# Patient Record
Sex: Male | Born: 1960 | Race: White | Hispanic: No | Marital: Married | Smoking: Never smoker
Health system: Southern US, Community
[De-identification: ages and names within clinical notes are randomized; demographics above are authoritative.]

## PROBLEM LIST (undated history)

## (undated) DIAGNOSIS — I2510 Atherosclerotic heart disease of native coronary artery without angina pectoris: Secondary | ICD-10-CM

## (undated) DIAGNOSIS — M1990 Unspecified osteoarthritis, unspecified site: Secondary | ICD-10-CM

## (undated) DIAGNOSIS — M109 Gout, unspecified: Secondary | ICD-10-CM

## (undated) DIAGNOSIS — C801 Malignant (primary) neoplasm, unspecified: Secondary | ICD-10-CM

## (undated) DIAGNOSIS — N200 Calculus of kidney: Secondary | ICD-10-CM

## (undated) DIAGNOSIS — K219 Gastro-esophageal reflux disease without esophagitis: Secondary | ICD-10-CM

## (undated) DIAGNOSIS — R0602 Shortness of breath: Secondary | ICD-10-CM

## (undated) DIAGNOSIS — I10 Essential (primary) hypertension: Secondary | ICD-10-CM

## (undated) DIAGNOSIS — G4730 Sleep apnea, unspecified: Secondary | ICD-10-CM

## (undated) DIAGNOSIS — D649 Anemia, unspecified: Secondary | ICD-10-CM

## (undated) DIAGNOSIS — E119 Type 2 diabetes mellitus without complications: Secondary | ICD-10-CM

## (undated) DIAGNOSIS — Z8719 Personal history of other diseases of the digestive system: Secondary | ICD-10-CM

## (undated) DIAGNOSIS — Z87442 Personal history of urinary calculi: Secondary | ICD-10-CM

## (undated) DIAGNOSIS — J45909 Unspecified asthma, uncomplicated: Secondary | ICD-10-CM

## (undated) HISTORY — PX: CORONARY STENT INTERVENTION: CATH118234

## (undated) HISTORY — PX: CHOLECYSTECTOMY: SHX55

---

## 1992-06-30 DIAGNOSIS — J45909 Unspecified asthma, uncomplicated: Secondary | ICD-10-CM

## 1992-06-30 HISTORY — DX: Unspecified asthma, uncomplicated: J45.909

## 1999-07-01 HISTORY — PX: LITHOTRIPSY: SUR834

## 2002-06-30 HISTORY — PX: ARTHROSCOPY KNEE W/ DRILLING: SUR92

## 2003-07-01 HISTORY — PX: NASAL SEPTUM SURGERY: SHX37

## 2012-10-28 ENCOUNTER — Other Ambulatory Visit: Payer: Self-pay | Admitting: Urology

## 2012-11-04 ENCOUNTER — Encounter (HOSPITAL_COMMUNITY): Payer: Self-pay | Admitting: Pharmacy Technician

## 2012-11-16 ENCOUNTER — Encounter (HOSPITAL_COMMUNITY): Payer: Self-pay | Admitting: *Deleted

## 2012-11-25 ENCOUNTER — Ambulatory Visit (HOSPITAL_COMMUNITY)
Admission: RE | Admit: 2012-11-25 | Discharge: 2012-11-25 | Disposition: A | Discharge status: Home / Self Care | Payer: BC Managed Care – PPO | Source: Ambulatory Visit | Attending: Urology | Admitting: Urology

## 2012-11-25 ENCOUNTER — Encounter (HOSPITAL_COMMUNITY): Payer: Self-pay

## 2012-11-25 ENCOUNTER — Encounter (HOSPITAL_COMMUNITY)
Admission: RE | Disposition: A | Discharge status: Home / Self Care | Payer: Self-pay | Source: Ambulatory Visit | Attending: Urology

## 2012-11-25 ENCOUNTER — Ambulatory Visit (HOSPITAL_COMMUNITY): Payer: BC Managed Care – PPO

## 2012-11-25 DIAGNOSIS — N200 Calculus of kidney: Secondary | ICD-10-CM | POA: Insufficient documentation

## 2012-11-25 DIAGNOSIS — Z888 Allergy status to other drugs, medicaments and biological substances status: Secondary | ICD-10-CM | POA: Insufficient documentation

## 2012-11-25 DIAGNOSIS — K589 Irritable bowel syndrome without diarrhea: Secondary | ICD-10-CM | POA: Insufficient documentation

## 2012-11-25 DIAGNOSIS — R339 Retention of urine, unspecified: Secondary | ICD-10-CM | POA: Insufficient documentation

## 2012-11-25 DIAGNOSIS — K219 Gastro-esophageal reflux disease without esophagitis: Secondary | ICD-10-CM | POA: Insufficient documentation

## 2012-11-25 DIAGNOSIS — K5730 Diverticulosis of large intestine without perforation or abscess without bleeding: Secondary | ICD-10-CM | POA: Insufficient documentation

## 2012-11-25 DIAGNOSIS — Z79899 Other long term (current) drug therapy: Secondary | ICD-10-CM | POA: Insufficient documentation

## 2012-11-25 DIAGNOSIS — IMO0002 Reserved for concepts with insufficient information to code with codable children: Secondary | ICD-10-CM | POA: Insufficient documentation

## 2012-11-25 HISTORY — DX: Sleep apnea, unspecified: G47.30

## 2012-11-25 HISTORY — DX: Unspecified asthma, uncomplicated: J45.909

## 2012-11-25 HISTORY — DX: Calculus of kidney: N20.0

## 2012-11-25 HISTORY — DX: Gastro-esophageal reflux disease without esophagitis: K21.9

## 2012-11-25 HISTORY — DX: Essential (primary) hypertension: I10

## 2012-11-25 HISTORY — DX: Shortness of breath: R06.02

## 2012-11-25 SURGERY — LITHOTRIPSY, ESWL
Anesthesia: LOCAL | Laterality: Right

## 2012-11-25 MED ORDER — DEXTROSE-NACL 5-0.45 % IV SOLN
INTRAVENOUS | Status: DC
  Administered 2012-11-25: 08:00:00 via INTRAVENOUS

## 2012-11-25 MED ORDER — DIPHENHYDRAMINE HCL 25 MG PO CAPS
25.0000 mg | ORAL_CAPSULE | ORAL | Status: AC
  Administered 2012-11-25: 25 mg via ORAL
  Filled 2012-11-25: qty 1

## 2012-11-25 MED ORDER — CIPROFLOXACIN HCL 500 MG PO TABS
500.0000 mg | ORAL_TABLET | ORAL | Status: AC
  Administered 2012-11-25: 500 mg via ORAL
  Filled 2012-11-25: qty 1

## 2012-11-25 MED ORDER — DIAZEPAM 5 MG PO TABS
10.0000 mg | ORAL_TABLET | ORAL | Status: AC
  Administered 2012-11-25: 10 mg via ORAL
  Filled 2012-11-25: qty 2

## 2012-11-25 NOTE — Interval H&P Note (Signed)
History and Physical Interval Note:  11/25/2012 8:56 AM  Hunter Krause  has presented today for surgery, with the diagnosis of RIGHT RENAL STONE  The various methods of treatment have been discussed with the patient and family. After consideration of risks, benefits and other options for treatment, the patient has consented to  Procedure(s): RIGHT EXTRACORPOREAL SHOCK WAVE LITHOTRIPSY (ESWL) (Right) as a surgical intervention .  The patient's history has been reviewed, patient examined, no change in status, stable for surgery.  I have reviewed the patient's chart and labs.  Questions were answered to the patient's satisfaction.     [name] I

## 2012-11-25 NOTE — H&P (Signed)
ason For Visit  1 mo f/u & to review CT/24hr urine results   Active Problems Problems  1. Abdominal Pain In The Left Lower Belly (LLQ) 789.04 2. Flank Pain Left 3. Incomplete Emptying Of Bladder 788.21 4. Nephrolithiasis 592.0  History of Present Illness          52 YO male returns today for a 1 mo f/u to review CT & 24hr urine results.  He continues to have some Lt flank discomfort.  He was last seen by Jetta Lout, NP on 09/23/12 as a work-in with complaint of LLQ and left pain.   Last seen 09/15/12 for a 6 mo f/u & KUB was having LLQ pain at that time and since 09/11/12. He has spontanously passed 2 stones since 12/13.  Drinks 2-3 cans of Dt. Coke/day.   Hx of calicum oxlate stones and diverticulosis. Takes HCTZ 12.5 mg 1 po daily, Urocit-K 10 MEQ 2 po BID, Beelith 1 po BID, Allopurinol 300 mg 1 po daily, and Tamsulosin when he feels like he has a kidney stone moving. Takes Hyoscyamaine for IBS. Had abdominal ultrasound 2009 and it showed some small stones in each kidney- No hydronephrosis, masses, or ureteral obstruction- rest of organs viewed were normal. Hx of diverticulosis seen on CT 2009.   Had recent CT abd/pelvis 11/13 which showed bilateral nonobstructing renal stones,. KUB: 09/15/12: bilateral nonobstructing renal calculi. No bilateral ureteral calculus noted.  07/30/10  PSA - 0.78   Past Medical History Problems  1. History of  Diverticulosis 562.10 2. History of  Esophageal Reflux 530.81 3. History of  Irritable Bowel Syndrome 564.1 4. History of  Nephrolithiasis V13.01  Surgical History Problems  1. History of  Knee Surgery  Current Meds 1. Advair Diskus 500-50 MCG/DOSE Inhalation Aerosol Powder Breath Activated; Therapy:  26Feb2013 to 2. Albuterol Sulfate (2.5 MG/3ML) 0.083% Inhalation Nebulization Solution; Therapy: 07Feb2013 to 3. Aleve TABS; Therapy: (Recorded:10Jan2012) to 4. Allopurinol 300 MG Oral Tablet; TAKE 1 TABLET (300 MG TOTAL) BY MOUTH DAILY;  Therapy:  16Sep2013 to (Evaluate:18Dec2014)  Requested for: 31Dec2013; Last Rx:23Dec2013 5. Azelastine HCl 137 MCG/SPRAY Nasal Solution; Therapy: 11Oct2013 to 6. Beelith 362-20 MG Oral Tablet; TAKE 1 TABLET Twice daily; Therapy: 22Feb2013 to  (Evaluate:17Feb2014)  Requested for: 22Feb2013; Last Rx:22Feb2013 7. Clarinex TABS; Therapy: (Recorded:17Sep2013) to 8. Combivent Respimat 20-100 MCG/ACT Inhalation Aerosol Solution; Therapy: 21Aug2013 to 9. Hydrochlorothiazide 25 MG Oral Tablet; TAKE 1 TABLET DAILY; Therapy: 22Feb2013 to  (Evaluate:06Nov2014)  Requested for: 11Mar2014; Last Rx:11Mar2014 10. Melatonin TABS; Therapy: (Recorded:17Sep2013) to 11. Montelukast Sodium 10 MG Oral Tablet; Therapy: 20Sep2013 to 12. Nystatin 100000 UNIT/ML Mouth/Throat Suspension; Therapy: 08Nov2013 to 13. Prevacid 30 MG Oral Capsule Delayed Release; Therapy: (Recorded:27May2009) to 14. Tamsulosin HCl 0.4 MG Oral Capsule; TAKE 1 CAPSULE BY MOUTH BEDTIME; Therapy:   22Aug2012 to (Evaluate:17Oct2014)  Requested for: 22Oct2013; Last Rx:22Oct2013 15. Tylenol PM Extra Strength TABS; Therapy: (Recorded:17Sep2013) to 16. Urocit-K 15 15 MEQ (1620 MG) Oral Tablet Extended Release; take 2 tablets by mouth twice a   day; Therapy: 17Sep2013 to (Evaluate:14Mar2014)  Requested for: 20Sep2013; Last   Rx:20Sep2013 17. Zyflo 600 MG Oral Tablet; Therapy: 11Oct2013 to  Allergies Medication  1. Spiriva HandiHaler CAPS  Family History Problems  1. Family history of  Death In The Family Father deceased age 38--heart 2. Family history of  Death In The Family Mother deceased age 97--stroke 3. Maternal history of  Diabetes Mellitus V18.0 4. Maternal history of  Family Health Status Number Of Children 1 son 1  daughter 5. Family history of  Heart Disease V17.49 6. Paternal history of  Heart Disease V17.49 7. Maternal history of  Ischemic Stroke V17.1 8. Family history of  Nephrolithiasis 9. Paternal history of   Nephrolithiasis 10. Family history of  Prostate Cancer V16.42 11. Maternal history of  Stroke Syndrome V17.1  Social History Problems  1. Alcohol Use 2. Caffeine Use 3. Marital History - Currently Married 4. Never A Smoker Denied  5. Tobacco Use  Review of Systems Genitourinary, constitutional, skin, eye, otolaryngeal, hematologic/lymphatic, cardiovascular, pulmonary, endocrine, musculoskeletal, gastrointestinal, neurological and psychiatric system(s) were reviewed and pertinent findings if present are noted.  Genitourinary: hematuria.  Gastrointestinal: flank pain.    Vitals Vital Signs [Data Includes: Last 1 Day]  30Apr2014 11:17AM  Blood Pressure: 136 / 88 Temperature: 98.3 F Heart Rate: 87  Physical Exam Constitutional: Well nourished and well developed . No acute distress.  ENT:. The ears and nose are normal in appearance.  Neck: The appearance of the neck is normal and no neck mass is present.  Pulmonary: No respiratory distress.  Cardiovascular:. The arterial pulses are normal. No peripheral edema.  Abdomen: The abdomen is soft and nontender. No masses are palpated. No CVA tenderness. No hernias are palpable. No hepatosplenomegaly noted.  Genitourinary: Examination of the penis demonstrates no discharge, no masses, no lesions and a normal meatus. The scrotum is without lesions. The right epididymis is palpably normal and non-tender. The left epididymis is palpably normal and non-tender. The right testis is non-tender and without masses. The left testis is non-tender and without masses.  Lymphatics: The femoral and inguinal nodes are not enlarged or tender.  Skin: Normal skin turgor, no visible rash and no visible skin lesions.  Neuro/Psych:. Mood and affect are appropriate.    Results/Data Urine [Data Includes: Last 1 Day]   30Apr2014  COLOR YELLOW   APPEARANCE CLEAR   SPECIFIC GRAVITY 1.020   pH 6.0   GLUCOSE NEG mg/dL  BILIRUBIN NEG   KETONE NEG mg/dL  BLOOD  TRACE   PROTEIN NEG mg/dL  UROBILINOGEN 0.2 mg/dL  NITRITE NEG   LEUKOCYTE ESTERASE NEG   SQUAMOUS EPITHELIAL/HPF NONE SEEN   WBC NONE SEEN WBC/hpf  RBC 0-2 RBC/hpf  BACTERIA NONE SEEN   CRYSTALS NONE SEEN   CASTS NONE SEEN   Selected Results  CT-ABD/PELVIS W/W/O CONTRAST 02Apr2014 12:00AM Hunter Krause   Test Name Result Flag Reference  ** RADIOLOGY REPORT BY [facility] **   *RADIOLOGY REPORT*  Clinical Data: Left lower quadrant pain for the past few months. History of irritable bowel syndrome. History renal stones. Prior lithotripsy.  CT ABDOMEN AND PELVIS WITHOUT AND WITH CONTRAST  Technique: Multidetector CT imaging of the abdomen and pelvis was performed without contrast material in one or both body regions, followed by contrast material(s) and further sections in one or both body regions.  Contrast: 125  Comparison: Plain films of 09/23/2012. CT stone study 05/20/2012.  Findings: Lung bases: Normal  Kidneys/Ureters/Bladder: Bilateral renal collecting system calculi, with the largest stone in the upper pole right kidney at 9 mm. No hydroureter or ureteric calculi.  No bladder calculi. No suspicious renal lesion on postcontrast images. Normal appearance of the renal collecting systems and ureters on delayed images. Normal bladder, incompletely filled on delayed images.  Other: Moderate hepatic steatosis, without focal liver lesion. Small splenule. Normal stomach, pancreas, gallbladder, biliary tract, adrenal glands  Multiple renal arteries bilaterally. Aortic atherosclerosis. No retroperitoneal or retrocrural adenopathy.  Scattered colonic diverticula.  Normal terminal ileum and appendix. Normal small bowel without abdominal ascites.   No pelvic adenopathy.  Normal prostate, without significant free pelvic fluid.  Bones/Musculoskeletal: No acute osseous abnormality.  IMPRESSION:  1. Bilateral nonobstructive renal calculi. 2. No other  explanation for pain. 3. Moderate hepatic steatosis.   Original Report Authenticated By: Jeronimo Greaves, M.D.   Assessment Assessed  1. Nephrolithiasis 592.0      52 yo male with recurrent stones. He has had 1st generation lithotripsy at [facility], and again in [place] in the distant past. He has passed 2 stones recently, which will be sent for analysis.    CT review with the patient shows bilateral stones: R side 9mm RUP stone and larger RLP stone ( possible coalesced small stones). On the L side, he has Upper, mid, and lower pole stones, 2-74mm. He has passed multiple stones in 2012/07/20, 5mm, and these will be sent for analysis. He had to stop Urocit K because of nausea. He sill try again with food.  Fast foods: 2x/week: Mindi Slicker: Whopper/ Fries/ Dt. Coke.   Plan Abdominal Pain In The Left Lower Belly (LLQ) (789.04)  1. TraMADol HCl 50 MG Oral Tablet; TAKE 1 TABLET Every 6 hours PRN; Therapy: 27Mar2014 to  30Apr2014; Last Rx:27Mar2014; Status: DISCONTINUED Health Maintenance (V70.0)  2. UA With REFLEX  Done: 30Apr2014 10:47AM Nephrolithiasis (592.0)  3. Beelith 362-20 MG Oral Tablet; Take 1 tablet twice daily; Therapy: 22Feb2013 to  (Evaluate:25Apr2015)  Requested for: 30Apr2014; Last Rx:30Apr2014 4. Sprix 15.75 MG/SPRAY Nasal Solution; INSTILL 1 SQUIRT Every 8 hours; Therapy: 19Mar2014 to  30Apr2014; Last Rx:19Mar2014; Status: DISCONTINUED 5. Tamsulosin HCl 0.4 MG Oral Capsule; TAKE 1 CAPSULE BY MOUTH BEDTIME; Therapy:  22Aug2012 to (Evaluate:25Apr2015)  Requested for: 30Apr2014; Last Rx:30Apr2014   1. send stones for analysis.  2. Continue tamsulosin, Beelith, HCTZ, allopurinol.  3. lithotripsy of R kidney stones.  4. Begin substitution: Lemonade instead of Dt. Coke ( 16-24 oz). at [facility].   Signatures Electronically signed by : Hunter Krause, M.D.; Oct 27 2012 11:51AM

## 2013-09-27 ENCOUNTER — Other Ambulatory Visit: Payer: Self-pay | Admitting: Urology

## 2013-09-28 ENCOUNTER — Encounter (HOSPITAL_COMMUNITY): Payer: Self-pay | Admitting: Pharmacy Technician

## 2013-10-13 ENCOUNTER — Encounter (HOSPITAL_COMMUNITY): Payer: Self-pay | Admitting: *Deleted

## 2013-10-20 ENCOUNTER — Ambulatory Visit (HOSPITAL_COMMUNITY)
Admission: RE | Admit: 2013-10-20 | Discharge: 2013-10-20 | Disposition: A | Discharge status: Home / Self Care | Payer: BC Managed Care – PPO | Source: Ambulatory Visit | Attending: Urology | Admitting: Urology

## 2013-10-20 ENCOUNTER — Ambulatory Visit (HOSPITAL_COMMUNITY): Payer: BC Managed Care – PPO

## 2013-10-20 ENCOUNTER — Encounter (HOSPITAL_COMMUNITY)
Admission: RE | Disposition: A | Discharge status: Home / Self Care | Payer: Self-pay | Source: Ambulatory Visit | Attending: Urology

## 2013-10-20 ENCOUNTER — Encounter (HOSPITAL_COMMUNITY): Payer: Self-pay | Admitting: *Deleted

## 2013-10-20 DIAGNOSIS — R310 Gross hematuria: Secondary | ICD-10-CM | POA: Insufficient documentation

## 2013-10-20 DIAGNOSIS — K449 Diaphragmatic hernia without obstruction or gangrene: Secondary | ICD-10-CM | POA: Insufficient documentation

## 2013-10-20 DIAGNOSIS — J45909 Unspecified asthma, uncomplicated: Secondary | ICD-10-CM | POA: Insufficient documentation

## 2013-10-20 DIAGNOSIS — I10 Essential (primary) hypertension: Secondary | ICD-10-CM | POA: Insufficient documentation

## 2013-10-20 DIAGNOSIS — N200 Calculus of kidney: Secondary | ICD-10-CM

## 2013-10-20 DIAGNOSIS — E669 Obesity, unspecified: Secondary | ICD-10-CM | POA: Insufficient documentation

## 2013-10-20 DIAGNOSIS — R109 Unspecified abdominal pain: Secondary | ICD-10-CM | POA: Insufficient documentation

## 2013-10-20 DIAGNOSIS — Z79899 Other long term (current) drug therapy: Secondary | ICD-10-CM | POA: Insufficient documentation

## 2013-10-20 DIAGNOSIS — K219 Gastro-esophageal reflux disease without esophagitis: Secondary | ICD-10-CM | POA: Insufficient documentation

## 2013-10-20 SURGERY — LITHOTRIPSY, ESWL
Anesthesia: LOCAL | Laterality: Right

## 2013-10-20 MED ORDER — CIPROFLOXACIN HCL 500 MG PO TABS
500.0000 mg | ORAL_TABLET | ORAL | Status: AC
  Administered 2013-10-20: 500 mg via ORAL
  Filled 2013-10-20: qty 1

## 2013-10-20 MED ORDER — DIAZEPAM 5 MG PO TABS
10.0000 mg | ORAL_TABLET | ORAL | Status: AC
  Administered 2013-10-20: 10 mg via ORAL
  Filled 2013-10-20: qty 2

## 2013-10-20 MED ORDER — DIPHENHYDRAMINE HCL 25 MG PO CAPS
25.0000 mg | ORAL_CAPSULE | ORAL | Status: AC
  Administered 2013-10-20: 25 mg via ORAL
  Filled 2013-10-20: qty 1

## 2013-10-20 MED ORDER — DEXTROSE-NACL 5-0.45 % IV SOLN
INTRAVENOUS | Status: DC
  Administered 2013-10-20: 08:00:00 via INTRAVENOUS

## 2013-10-20 NOTE — H&P (Signed)
Reason For Visit 6 mo f/u   Active Problems Problems  1. Gross hematuria (599.71)   Assessed By: [name] (Urology); Last Assessed: 26 Sep 2013 2. Incomplete bladder emptying (788.21) 3. Lower abdominal pain (789.09) 4. Lower back pain (724.2) 5. Nephrolithiasis (592.0)   Assessed By: [name] (Urology); Last Assessed: 26 Sep 2013 6. Right flank pain (789.09)   Assessed By: [name] (Urology); Last Assessed: 26 Sep 2013  History of Present Illness     53 yo male returns today for a 6 mo f/u with a hx of kidney stones. He states that he has been having intermittent Rt flank pain since last week & feels like he may be trying to pass a kidney stone. He hasn't been taking the Effer-K as prescribed because of stomach pain.     He was last seen by Denna Haggard, NP on 08/03/13 for right flank pain. This has been present intermittently he reports since probably around November. It occurs about 3 times per week and is brief in duration. When it occurs nothing makes it better or worse. He denies associated n/v or f/c. He reports he probably passes about 1 small stone a week. He has had his gallbladder removed since we last saw him.      He has a known large stone burden bilaterally. He takes tamsulosin, Beelith, HCTZ, and allopurinol daily.     He is status post ESWL on 11/25/12 for a right renal stone. After this he passed many large fragments. He had left flank pain post op. KUB and renal u/s showed no obvious obstructing stones that would be causing pain. CT 12/28/12 for ongoing flank pain showed bilateral non obstructing stones.   Past Medical History Problems  1. History of Diverticulosis (562.10) 2. History of esophageal reflux (V12.79) 3. History of kidney stones (V13.01) 4. History of Irritable bowel syndrome with diarrhea (564.1)  Surgical History Problems  1. History of Knee Surgery 2. History of Lithotripsy  Current Meds 1. Advair HFA  115-21 MCG/ACT Inhalation Aerosol;  Therapy: 11Apr2014 to Recorded 2. Albuterol Sulfate (2.5 MG/3ML) 0.083% Inhalation Nebulization Solution;  Therapy: 07Feb2013 to Recorded 3. Aleve TABS;  Therapy: (Recorded:10Jan2012) to Recorded 4. Allopurinol 300 MG Oral Tablet; TAKE 1 TABLET (300 MG TOTAL) BY MOUTH DAILY;  Therapy: 16Sep2013 to (Evaluate:20Sep2015)  Requested for: 24Mar2015; Last  Rx:24Mar2015 Ordered 5. Aspirin 81 MG Oral Tablet Delayed Release;  Therapy: 06Dec2014 to Recorded 6. Atorvastatin Calcium 10 MG Oral Tablet;  Therapy: 06Dec2014 to Recorded 7. Azelastine HCl - 137 MCG/SPRAY Nasal Solution;  Therapy: 11Oct2013 to Recorded 8. Clarinex TABS;  Therapy: (Recorded:17Sep2013) to Recorded 9. Effer-K 25 MEQ Oral Tablet Effervescent; TAKE AS DIRECTED;  Therapy: 29Sep2014 to (Evaluate:28Mar2015); Last Rx:29Sep2014 Ordered 10. Fluticasone Propionate 50 MCG/ACT Nasal Suspension;   Therapy: 20Dec2013 to Recorded 11. Hydrochlorothiazide 25 MG Oral Tablet; TAKE 1 TABLET DAILY;   Therapy: 22Feb2013 to (Evaluate:06Nov2014)  Requested for: 11Mar2014; Last   Rx:11Mar2014 Ordered 12. Ketorolac Tromethamine 10 MG Oral Tablet; Take 1 tablet every 6 hours;   Therapy: 20Nov2011 to (Last Rx:24Feb2015)  Requested for: 24Feb2015 Ordered 13. Melatonin TABS;   Therapy: (Recorded:17Sep2013) to Recorded 14. Prevacid 30 MG Oral Capsule Delayed Release;   Therapy: (Recorded:27May2009) to Recorded 15. ProAir HFA 108 (90 Base) MCG/ACT Inhalation Aerosol Solution;   Therapy: 26Mar2015 to Recorded 16. Promethazine HCl - 25 MG Oral Tablet; TAKE 1 TABLET EVERY 6 HOURS AS NEEDED   FOR NAUSEA;   Therapy: 12Jun2014 to (Evaluate:17Jun2014)  Requested for:  12Jun2014; Last   Rx:12Jun2014 Ordered 17. Qvar 80 MCG/ACT Inhalation Aerosol Solution;   Therapy: 19Mar2015 to Recorded 18. Tamsulosin HCl - 0.4 MG Oral Capsule; TAKE 1 CAPSULE BY MOUTH BEDTIME;   Therapy: 22Aug2012 to (Evaluate:25Apr2015)  Requested  for: 30Apr2014; Last   Rx:30Apr2014 Ordered 19. Tylenol PM Extra Strength TABS;   Therapy: (Recorded:17Sep2013) to Recorded 20. Zyflo 600 MG Oral Tablet;   Therapy: 11Oct2013 to Recorded  Allergies Medication  1. Spiriva HandiHaler CAPS  Family History Problems  1. Family history of Death In The Family Father   deceased age 71--heart 2. Family history of Death In The Family Mother   deceased age 71--stroke 3. Family history of Diabetes Mellitus (V18.0) : Mother 4. Family history of Family Health Status Number Of Children : Mother   1 son 1 daughter 5. Family history of Heart Disease (V17.49) 6. Family history of Heart Disease (V17.49) : Father 7. Family history of Ischemic Stroke (V17.1) : Mother 8. Family history of Nephrolithiasis 9. Family history of Nephrolithiasis : Father 78. Family history of Prostate Cancer (V16.42) 11. Family history of Stroke Syndrome (V17.1) : Mother  Social History Problems  1. Alcohol Use 2. Caffeine Use 3. Marital History - Currently Married 4. Never A Smoker 5. Denied: Tobacco Use  Review of Systems Genitourinary, constitutional, skin, eye, otolaryngeal, hematologic/lymphatic, cardiovascular, pulmonary, endocrine, musculoskeletal, gastrointestinal, neurological and psychiatric system(s) were reviewed and pertinent findings if present are noted.  Genitourinary: hematuria.  Gastrointestinal: flank pain.    Vitals Vital Signs [Data Includes: Last 1 Day]  Recorded: 30Mar2015 02:42PM  Height: 5 ft 9 in Weight: 263 lb  BMI Calculated: 38.84 BSA Calculated: 2.32 Blood Pressure: 136 / 85 Temperature: 98.1 F Heart Rate: 100  Physical Exam Constitutional: Well nourished and well developed . No acute distress.  ENT:. The ears and nose are normal in appearance.  Pulmonary: No respiratory distress and normal respiratory rhythm and effort.  Cardiovascular: Heart rate and rhythm are normal . No peripheral edema.  Abdomen: The abdomen is  obese. Mild tenderness in the RUQ is present. moderate right CVA tenderness.  Genitourinary: Examination of the penis demonstrates no discharge, no masses, no lesions and a normal meatus. The scrotum is without lesions. The right epididymis is palpably normal and non-tender. The left epididymis is palpably normal and non-tender. The right testis is non-tender and without masses. The left testis is non-tender and without masses.  Lymphatics: The femoral and inguinal nodes are not enlarged or tender.  Skin: Normal skin turgor, no visible rash and no visible skin lesions.  Neuro/Psych:. Mood and affect are appropriate.    Results/Data Urine [Data Includes: Last 1 Day]   30Mar2015  COLOR YELLOW   APPEARANCE CLEAR   SPECIFIC GRAVITY 1.010   pH 6.5   GLUCOSE NEG mg/dL  BILIRUBIN NEG   KETONE NEG mg/dL  BLOOD MOD   PROTEIN NEG mg/dL  UROBILINOGEN 0.2 mg/dL  NITRITE NEG   LEUKOCYTE ESTERASE NEG   SQUAMOUS EPITHELIAL/HPF RARE   WBC NONE SEEN WBC/hpf  RBC 0-2 RBC/hpf  BACTERIA NONE SEEN   CRYSTALS NONE SEEN   CASTS NONE SEEN   Selected Results  AU CT-STONE PROTOCOL 04Feb2015 12:00AM Denna Haggard   Test Name Result Flag Reference  CT-STONE PROTOCOL (Report)    ** RADIOLOGY REPORT BY [facility] **   CLINICAL DATA: Right-sided back pain. Hematuria. Urolithiasis.  EXAM: CT ABDOMEN AND PELVIS WITHOUT CONTRAST (URINARY CALCULUS PROTOCOL)  TECHNIQUE: Multidetector CT imaging was performed through the  abdomen and pelvis without intravenous contrast to include the urinary tract.  COMPARISON: 12/28/2012  FINDINGS: Multiple small nonobstructive calculi are again seen in both kidneys largest measuring 9 mm in the mid pole of the right kidney. No evidence of hydronephrosis. No evidence of ureteral calculi or dilatation. No bladder calculi identified.  Diffuse hepatic steatosis again noted. Surgical clips seen from prior cholecystectomy. No evidence of biliary ductal  dilatation. Noncontrast images of the pancreas, spleen, and adrenal glands are normal in appearance.  No soft tissue masses or lymphadenopathy identified in this noncontrast exam. No evidence of inflammatory process or abnormal fluid collections. No evidence of dilated bowel loops. Normal appendix visualized. No suspicious bone lesion seen.  IMPRESSION: Multiple bilateral nonobstructive renal calculi. No evidence of ureteral calculi, hydronephrosis, or other acute findings.  Stable hepatic steatosis.   Electronically Signed  By: Myles RosenthalJohn Stahl M.D.  On: 08/03/2013 15:31   Assessment Assessed  1. Nephrolithiasis (592.0) 2. Right flank pain (789.09) 3. Gross hematuria (599.71)  Gross hematuria, R flank pain , in pt with hx of nephrolithiasis. CT shows bilateral nephrolithiasis, 2 in the R and 2 in the L. largest is 9mm R lower pole stone and 6mm RUP stone. 2 separate 6mm upper and lower pole stones. No hydro.   Plan Gross hematuria, Nephrolithiasis, Right flank pain  1. Administered: Ketorolac Tromethamine 60 MG/2ML Injection Solution 2. AU CT-STONE PROTOCOL; Status:In Progress - Specimen/Data Collected;   Done:  30Mar2015 12:00AM Health Maintenance  3. UA With REFLEX; [Do Not Release]; Status:Resulted - Requires Verification;   Done:  30Mar2015 02:07PM Nephrolithiasis, Right flank pain  4. Start: Oxycodone-Acetaminophen 5-325 MG Oral Tablet; TAKE 1 TABLET Every  4 hours  PRN pain 5. Start: Tamsulosin HCl - 0.4 MG Oral Capsule; TAKE 1 CAPSULE Bedtime Right flank pain  6. Administer: Ketorolac Tromethamine 60 MG/2ML Injection Solution; INJECT 60  MG  Intramuscular  Toradol 60mg  now ans RTC for CT in AM if CT scan is repaired.  Lithotripsy of the R lower pole stone. first. Begin Flomax.   RTC for Sprix script.   Discussion/Summary [facility], [address]     Signatures Electronically signed by : Jethro BolusSigmund Makhari Dovidio, M.D.; Sep 26 2013  4:50PM EST

## 2013-10-20 NOTE — Interval H&P Note (Signed)
History and Physical Interval Note:  10/20/2013 8:44 AM  Hunter Krause  has presented today for surgery, with the diagnosis of Right Renal Stone  The various methods of treatment have been discussed with the patient and family. After consideration of risks, benefits and other options for treatment, the patient has consented to  Procedure(s): RIGHT EXTRACORPOREAL SHOCK WAVE LITHOTRIPSY (ESWL) (Right) as a surgical intervention .  The patient's history has been reviewed, patient examined, no change in status, stable for surgery.  I have reviewed the patient's chart and labs.  Questions were answered to the patient's satisfaction.     Kathi Ludwig

## 2013-10-20 NOTE — Discharge Instructions (Addendum)
Lithotripsy, Care After °Refer to this sheet in the next few weeks. These instructions provide you with information on caring for yourself after your procedure. Your health care provider may also give you more specific instructions. Your treatment has been planned according to current medical practices, but problems sometimes occur. Call your health care provider if you have any problems or questions after your procedure. °WHAT TO EXPECT AFTER THE PROCEDURE  °· Your urine may have a red tinge for a few days after treatment. Blood loss is usually minimal. °· You may have soreness in the back or flank area. This usually goes away after a few days. The procedure can cause blotches or bruises on the back where the pressure wave enters the skin. These marks usually cause only minimal discomfort and should disappear in a short time. °· Stone fragments should begin to pass within 24 hours of treatment. However, a delayed passage is not unusual. °· You may have pain, discomfort, and feel sick to your stomach (nauseated) when the crushed fragments of stone are passed down the tube from the kidney to the bladder. Stone fragments can pass soon after the procedure and may last for up to 4 8 weeks. °· A small number of patients may have severe pain when stone fragments are not able to pass, which leads to an obstruction. °· If your stone is greater than 1 inch (2.5 cm) in diameter or if you have multiple stones that have a combined diameter greater than 1 inch (2.5 cm), you may require more than one treatment. °· If you had a stent placed prior to your procedure, you may experience some discomfort, especially during urination. You may experience the pain or discomfort in your flank or back, or you may experience a sharp pain or discomfort at the base of your penis or in your lower abdomen. The discomfort usually lasts only a few minutes after urinating. °HOME CARE INSTRUCTIONS  °· Rest at home until you feel your energy  improving. °· Only take over-the-counter or prescription medicines for pain, discomfort, or fever as directed by your health care provider. Depending on the type of lithotripsy, you may need to take antibiotics and anti-inflammatory medicines for a few days. °· Drink enough water and fluids to keep your urine clear or pale yellow. This helps "flush" your kidneys. It helps pass any remaining pieces of stone and prevents stones from coming back. °· Most people can resume daily activities within 1 2 days after standard lithotripsy. It can take longer to recover from laser and percutaneous lithotripsy. °· If the stones are in your urinary system, you may be asked to strain your urine at home to look for stones. Any stones that are found can be sent to a medical lab for examination. °· Visit your health care provider for a follow-up appointment in a few weeks. Your doctor may remove your stent if you have one. Your health care provider will also check to see whether stone particles still remain. °SEEK MEDICAL CARE IF:  °· Your pain is not relieved by medicine. °· You have a lasting nauseous feeling. °· You feel there is too much blood in the urine. °· You develop persistent problems with frequent or painful urination that does not at least partially improve after 2 days following the procedure. °· You have a congested cough. °· You feel lightheaded. °· You develop a rash or any other signs that might suggest an allergic problem. °· You develop any reaction or side   effects to your medicine(s). SEEK IMMEDIATE MEDICAL CARE IF:   You experience severe back or flank pain or both.  You see nothing but blood when you urinate.  You cannot pass any urine at all.  You have a fever or shaking chills.  You develop shortness of breath, difficulty breathing, or chest pain.  You develop vomiting that will not stop after 6 8 hours.  You have a fainting episode. Document Released: 07/06/2007 Document Revised: 04/06/2013  Document Reviewed: 12/30/2012 [facility] Patient Information 2014 [place], [place].  Pt needs appointment with his PCP re: p[oorly controlled, labile hypertention.  Avoid: 1. Colas             2. Fast foods. High salt, High animal fat.              3. High doses of Vit c Begin: 3/4 gallon urine/day by drinking water, lemonade

## 2015-06-07 IMAGING — CR DG ABDOMEN 1V
2 series · 2 of 2 positions shown · non-contrast
Comparison: 02/08/2013; correlation CT abdomen and pelvis
09/26/2013

CLINICAL DATA: Nephrolithiasis, preoperative evaluation for right
side lithotripsy

EXAM:
ABDOMEN - 1 VIEW

[t abdomen supine (1 of 2)]
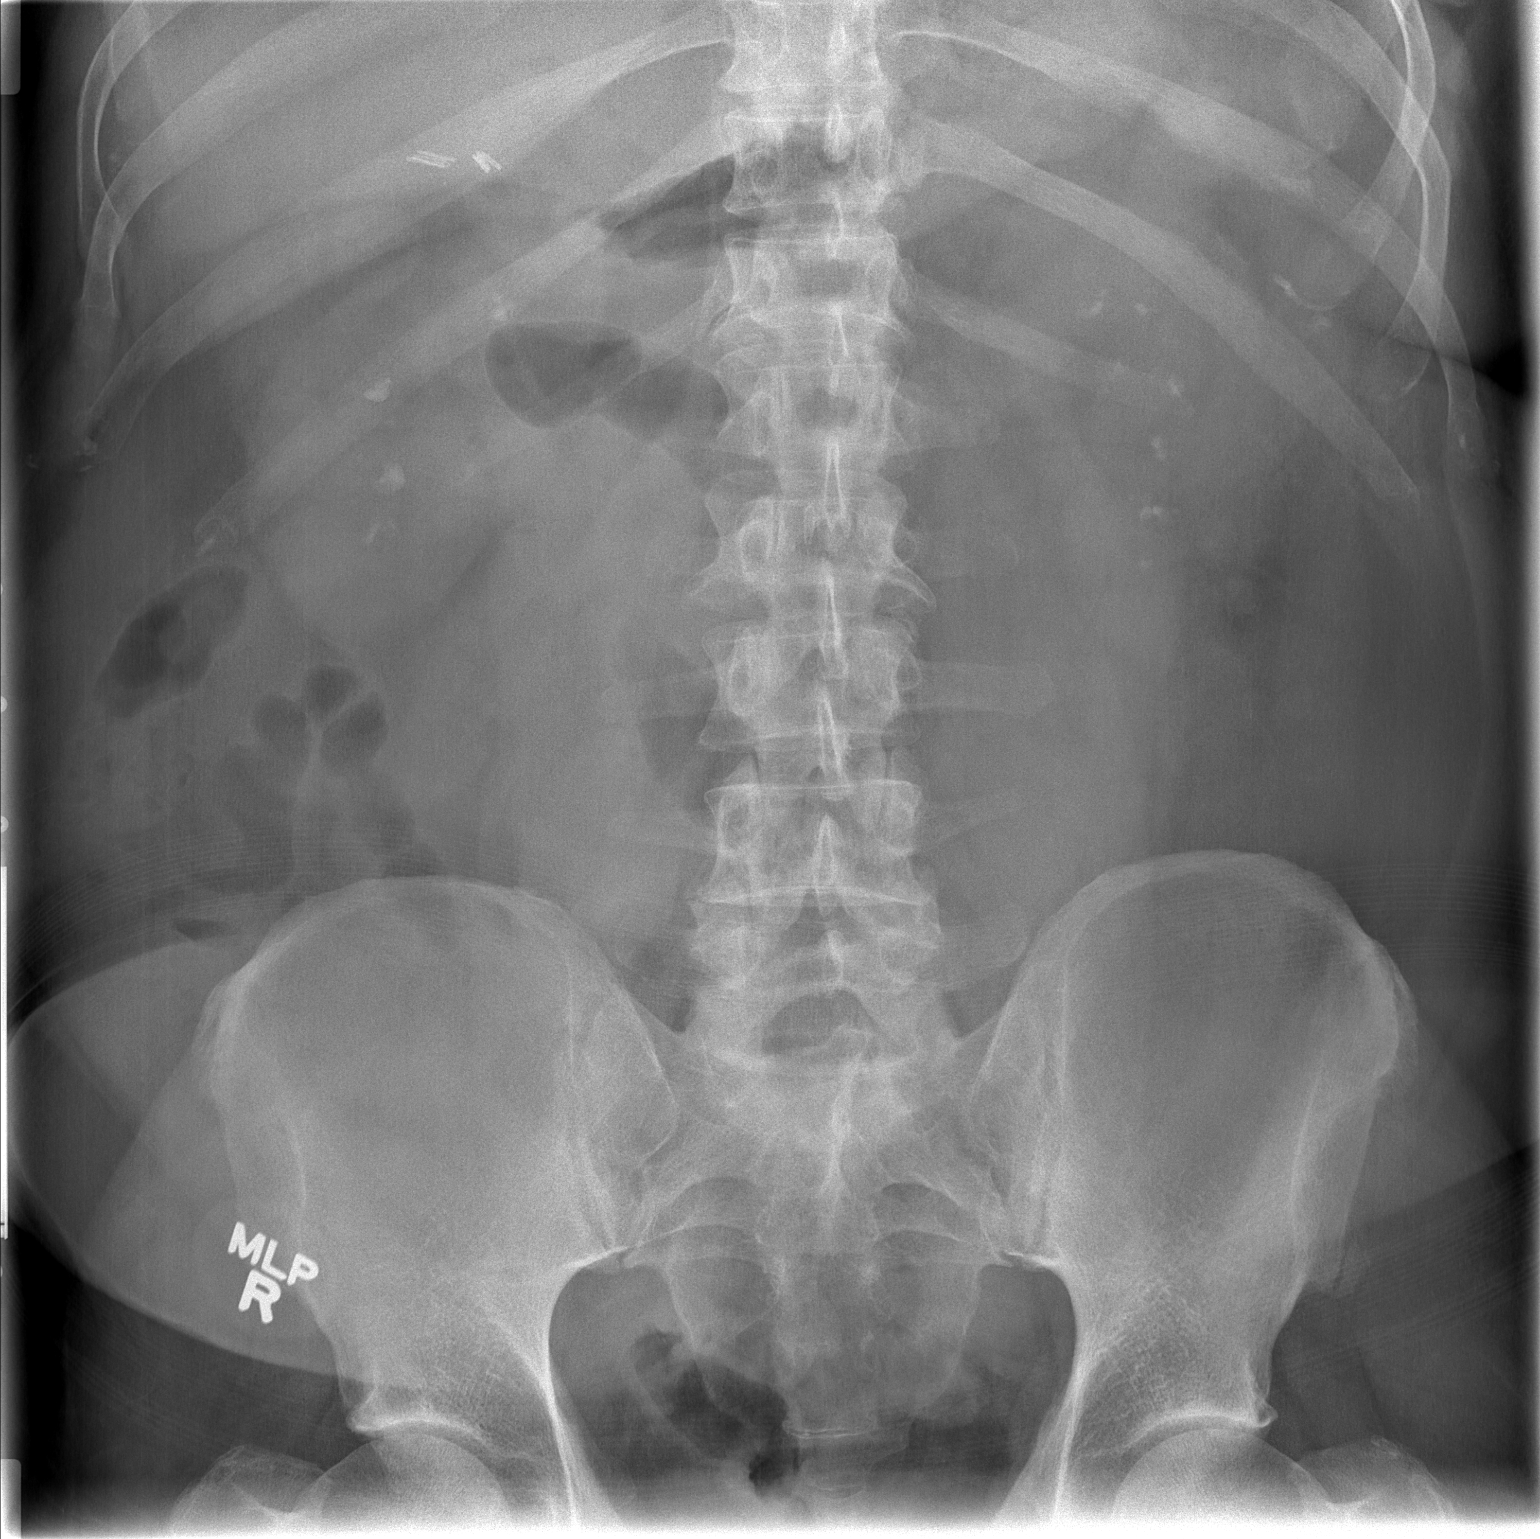

[t abdomen supine (2 of 2)]
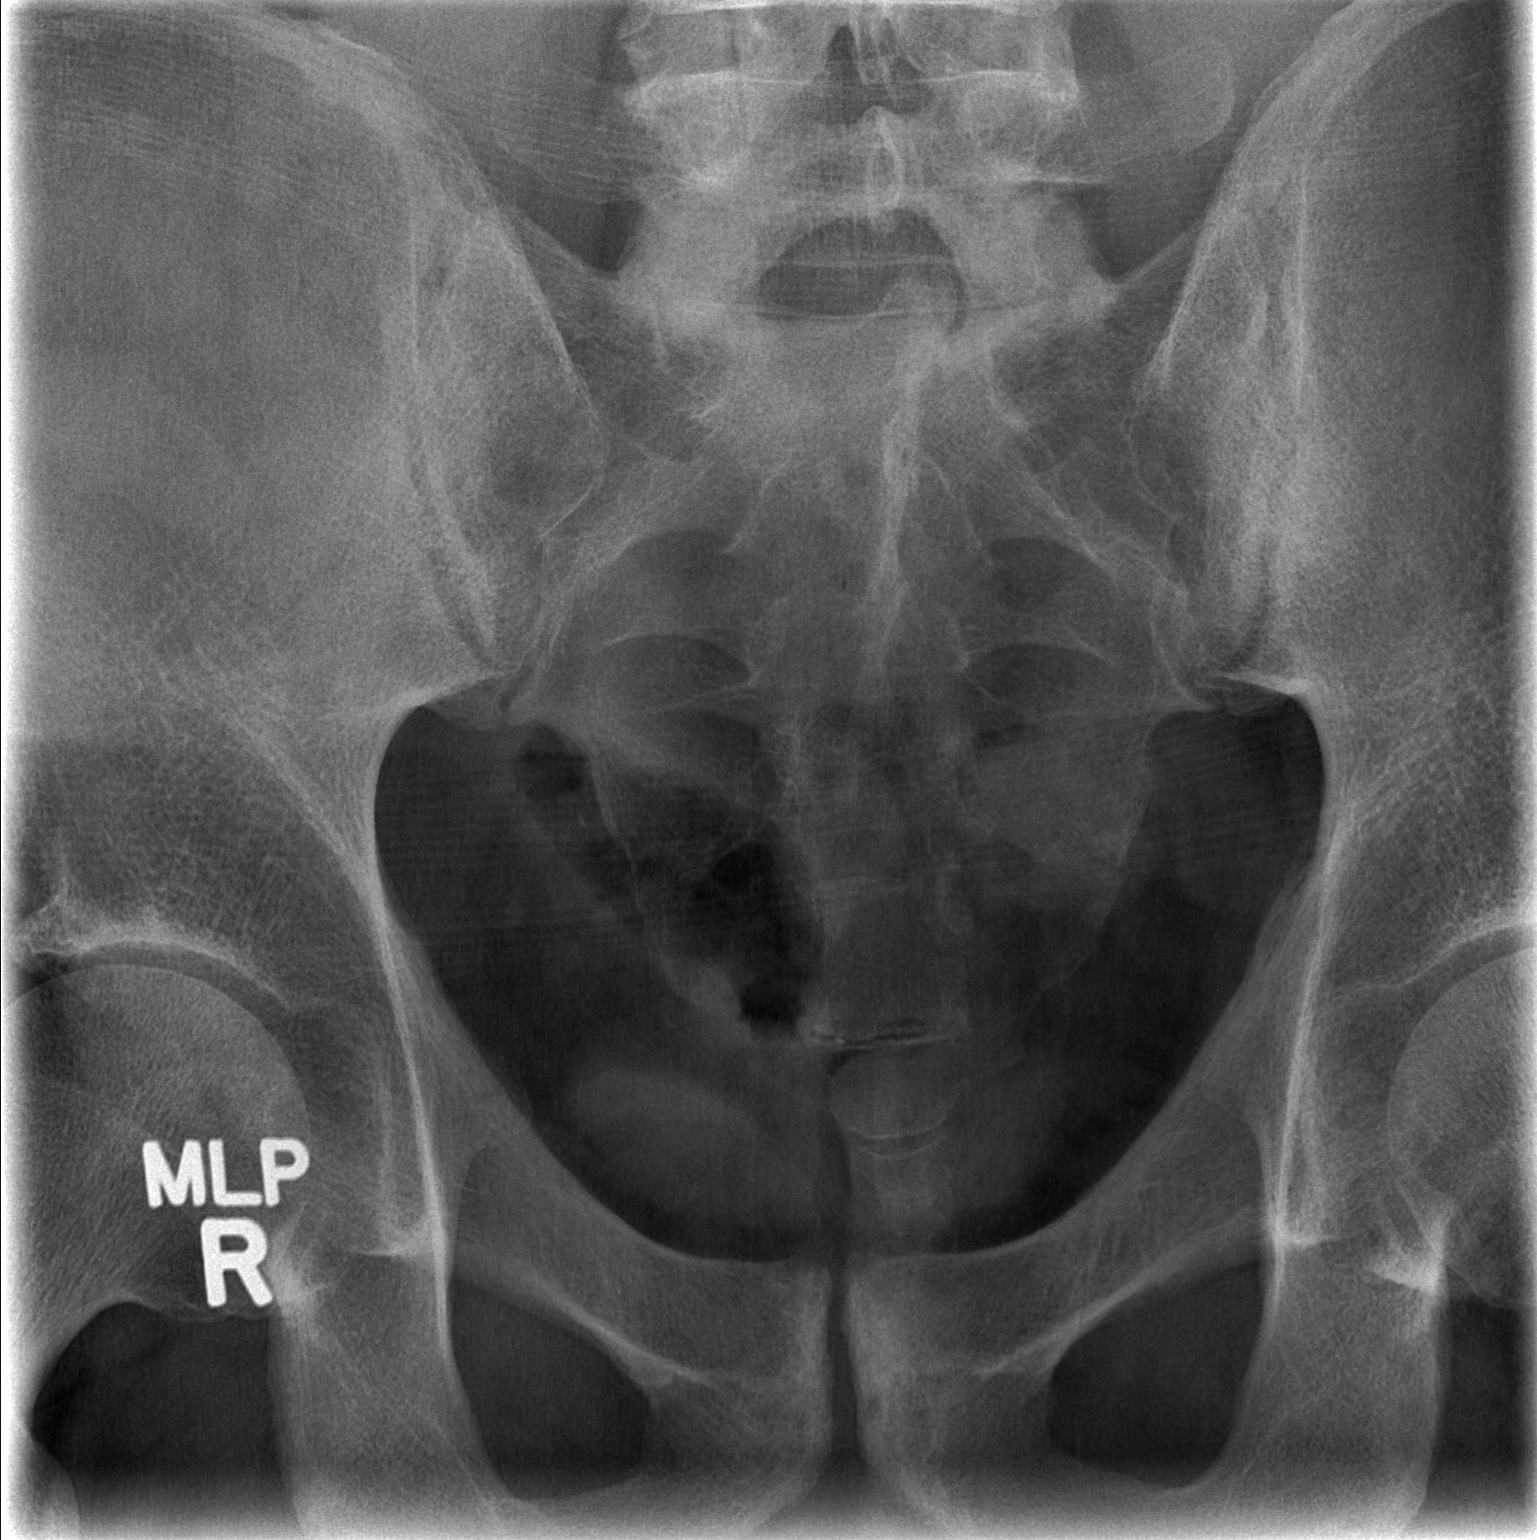

[2 of 2 positions shown; findings below may reference images not displayed]

FINDINGS: Multiple bilateral small renal calculi.

Largest right-side calculus measures 8 x 5 mm at mid kidney and 8 x
7 mm at mid to inferior pole.

Left side stones are slightly less distinct, largest 6 x 2 mm at mid
kidney.

No ureteral calcifications.

Bowel gas pattern normal.

Osseous structures unremarkable.
IMPRESSION: Bilateral nonobstructing renal calculi.

## 2017-06-30 DIAGNOSIS — I219 Acute myocardial infarction, unspecified: Secondary | ICD-10-CM

## 2017-06-30 HISTORY — PX: CORONARY ANGIOPLASTY: SHX604

## 2017-06-30 HISTORY — DX: Acute myocardial infarction, unspecified: I21.9

## 2018-05-30 DIAGNOSIS — J189 Pneumonia, unspecified organism: Secondary | ICD-10-CM

## 2018-05-30 HISTORY — DX: Pneumonia, unspecified organism: J18.9

## 2018-06-30 HISTORY — PX: OTHER SURGICAL HISTORY: SHX169

## 2020-08-28 ENCOUNTER — Other Ambulatory Visit: Payer: Self-pay | Admitting: Urology

## 2020-08-28 DIAGNOSIS — N201 Calculus of ureter: Secondary | ICD-10-CM

## 2020-08-30 NOTE — Progress Notes (Signed)
Left message for patient regarding ESWL for 09/03/2020. Instructed to not take ASA beginning tomorrow until after the procedure. Will attempt to call again tomorrow to give further instructions.

## 2020-08-31 ENCOUNTER — Other Ambulatory Visit (HOSPITAL_COMMUNITY)
Admission: RE | Admit: 2020-08-31 | Discharge: 2020-08-31 | Disposition: A | Discharge status: Home / Self Care | Payer: BC Managed Care – PPO | Source: Ambulatory Visit | Attending: Urology | Admitting: Urology

## 2020-08-31 DIAGNOSIS — Z20822 Contact with and (suspected) exposure to covid-19: Secondary | ICD-10-CM | POA: Insufficient documentation

## 2020-08-31 DIAGNOSIS — N202 Calculus of kidney with calculus of ureter: Secondary | ICD-10-CM | POA: Diagnosis not present

## 2020-08-31 DIAGNOSIS — Z01812 Encounter for preprocedural laboratory examination: Secondary | ICD-10-CM | POA: Insufficient documentation

## 2020-08-31 DIAGNOSIS — Z5309 Procedure and treatment not carried out because of other contraindication: Secondary | ICD-10-CM | POA: Diagnosis not present

## 2020-08-31 LAB — SARS CORONAVIRUS 2 (TAT 6-24 HRS): SARS Coronavirus 2: NEGATIVE

## 2020-08-31 NOTE — Progress Notes (Signed)
Spoke with patients wife since he was unavailable. History and medications reviewed. All pre-procedure instructions given. NPO after MN on Sunday. Clear liquids until 0900. BP medication with sip of water in AM of procedure. Patient has been off his ASA and Naprosyn as instructed. Driver secured.Marland Kitchen

## 2020-09-03 ENCOUNTER — Ambulatory Visit (HOSPITAL_COMMUNITY): Payer: BC Managed Care – PPO

## 2020-09-03 ENCOUNTER — Encounter (HOSPITAL_BASED_OUTPATIENT_CLINIC_OR_DEPARTMENT_OTHER)
Admission: RE | Disposition: A | Discharge status: Home / Self Care | Payer: Self-pay | Source: Home / Self Care | Attending: Urology

## 2020-09-03 ENCOUNTER — Ambulatory Visit (HOSPITAL_BASED_OUTPATIENT_CLINIC_OR_DEPARTMENT_OTHER)
Admission: RE | Admit: 2020-09-03 | Discharge: 2020-09-03 | Disposition: A | Discharge status: Home / Self Care | Payer: BC Managed Care – PPO | Attending: Urology | Admitting: Urology

## 2020-09-03 ENCOUNTER — Other Ambulatory Visit: Payer: Self-pay

## 2020-09-03 ENCOUNTER — Encounter (HOSPITAL_BASED_OUTPATIENT_CLINIC_OR_DEPARTMENT_OTHER): Payer: Self-pay | Admitting: Urology

## 2020-09-03 DIAGNOSIS — N201 Calculus of ureter: Secondary | ICD-10-CM

## 2020-09-03 DIAGNOSIS — Z5309 Procedure and treatment not carried out because of other contraindication: Secondary | ICD-10-CM | POA: Insufficient documentation

## 2020-09-03 DIAGNOSIS — Z20822 Contact with and (suspected) exposure to covid-19: Secondary | ICD-10-CM | POA: Insufficient documentation

## 2020-09-03 DIAGNOSIS — N202 Calculus of kidney with calculus of ureter: Secondary | ICD-10-CM | POA: Insufficient documentation

## 2020-09-03 HISTORY — PX: EXTRACORPOREAL SHOCK WAVE LITHOTRIPSY: SHX1557

## 2020-09-03 SURGERY — LITHOTRIPSY, ESWL
Anesthesia: LOCAL | Laterality: Left

## 2020-09-03 MED ORDER — SODIUM CHLORIDE 0.9 % IV SOLN: INTRAVENOUS | Status: DC

## 2020-09-03 MED ORDER — CIPROFLOXACIN HCL 500 MG PO TABS
500.0000 mg | ORAL_TABLET | ORAL | Status: AC
  Administered 2020-09-03: 500 mg via ORAL

## 2020-09-03 MED ORDER — DIPHENHYDRAMINE HCL 25 MG PO CAPS
ORAL_CAPSULE | ORAL | Status: AC
  Filled 2020-09-03: qty 1

## 2020-09-03 MED ORDER — CIPROFLOXACIN HCL 500 MG PO TABS
ORAL_TABLET | ORAL | Status: AC
  Filled 2020-09-03: qty 1

## 2020-09-03 MED ORDER — DIPHENHYDRAMINE HCL 25 MG PO CAPS
25.0000 mg | ORAL_CAPSULE | ORAL | Status: AC
  Administered 2020-09-03: 25 mg via ORAL

## 2020-09-03 MED ORDER — DIAZEPAM 5 MG PO TABS
10.0000 mg | ORAL_TABLET | ORAL | Status: AC
  Administered 2020-09-03: 10 mg via ORAL

## 2020-09-03 MED ORDER — DIAZEPAM 5 MG PO TABS
ORAL_TABLET | ORAL | Status: AC
  Filled 2020-09-03: qty 2

## 2020-09-03 NOTE — H&P (Signed)
See scanned H&P

## 2020-09-03 NOTE — Progress Notes (Signed)
Surgery canceled due to taking Plavix on Friday.

## 2020-09-04 ENCOUNTER — Encounter (HOSPITAL_BASED_OUTPATIENT_CLINIC_OR_DEPARTMENT_OTHER): Payer: Self-pay | Admitting: Urology

## 2020-09-04 ENCOUNTER — Other Ambulatory Visit: Payer: Self-pay | Admitting: Urology

## 2020-09-04 ENCOUNTER — Other Ambulatory Visit: Payer: Self-pay

## 2020-09-04 NOTE — Progress Notes (Addendum)
Spoke w/ via phone for pre-op interview---pt Lab needs dos----    I stat 8 ekg            Lab results------see below COVID test ------09-08-2020 930 Arrive at -------1100 am 09-10-2020 NPO after MN NO Solid Food.  Clear liquids from MN until-1000 am then npo Medications to take morning of surgery -----labuterol inhaler prn/bring inhaler, allopurinol, astelin, qvar, prevacid, claritin, metoprolol succinate, nasacort, flomax, zyflo Patient Special Instructions -----none Pre-Op special Istructions -----none Patient verbalized understanding of instructions that were given at this phone interview. Patient denies shortness of breath, chest pain, fever, cough at this phone interview.  Pulmonary lov dr Vira Browns 08-31-2020 [facility] Cardiology lov dr Purvis Sheffield 08-13-2020 [facility] Echo 08-08-2020 [facility] cta chest 06-27-2020 [facility]  Pt denies any oxygen use at this time gets sob with heavy exertion, pt denies any cardiac s & s at pre op call note on chart to stop plavix 4 days before surgery dr Purvis Sheffield, pt aware last dose plavix will be 08-08-2020.

## 2020-09-04 NOTE — Progress Notes (Signed)
Left message for pt or spouse to call back for instructions

## 2020-09-04 NOTE — Progress Notes (Signed)
Patient called back instructions given. Has not taken Plavix since Friday. Arrival time 1430 clear liquids until 1230 wife is the driver. Dr. Lenis Noon is his cardiologist from [facility].

## 2020-09-06 ENCOUNTER — Ambulatory Visit (HOSPITAL_BASED_OUTPATIENT_CLINIC_OR_DEPARTMENT_OTHER): Admit: 2020-09-06 | Payer: BC Managed Care – PPO | Admitting: Urology

## 2020-09-06 ENCOUNTER — Encounter (HOSPITAL_BASED_OUTPATIENT_CLINIC_OR_DEPARTMENT_OTHER): Payer: Self-pay

## 2020-09-06 SURGERY — LITHOTRIPSY, ESWL
Anesthesia: LOCAL | Laterality: Left

## 2020-09-08 ENCOUNTER — Other Ambulatory Visit (HOSPITAL_COMMUNITY)
Admission: RE | Admit: 2020-09-08 | Discharge: 2020-09-08 | Disposition: A | Discharge status: Home / Self Care | Payer: BC Managed Care – PPO | Source: Ambulatory Visit | Attending: Urology | Admitting: Urology

## 2020-09-08 DIAGNOSIS — Z887 Allergy status to serum and vaccine status: Secondary | ICD-10-CM | POA: Diagnosis not present

## 2020-09-08 DIAGNOSIS — Z01812 Encounter for preprocedural laboratory examination: Secondary | ICD-10-CM | POA: Insufficient documentation

## 2020-09-08 DIAGNOSIS — I2510 Atherosclerotic heart disease of native coronary artery without angina pectoris: Secondary | ICD-10-CM | POA: Diagnosis not present

## 2020-09-08 DIAGNOSIS — Z20822 Contact with and (suspected) exposure to covid-19: Secondary | ICD-10-CM | POA: Insufficient documentation

## 2020-09-08 DIAGNOSIS — N202 Calculus of kidney with calculus of ureter: Secondary | ICD-10-CM | POA: Diagnosis present

## 2020-09-08 DIAGNOSIS — Z79899 Other long term (current) drug therapy: Secondary | ICD-10-CM | POA: Diagnosis not present

## 2020-09-08 DIAGNOSIS — Z955 Presence of coronary angioplasty implant and graft: Secondary | ICD-10-CM | POA: Diagnosis not present

## 2020-09-08 LAB — SARS CORONAVIRUS 2 (TAT 6-24 HRS): SARS Coronavirus 2: NEGATIVE

## 2020-09-10 ENCOUNTER — Encounter (HOSPITAL_BASED_OUTPATIENT_CLINIC_OR_DEPARTMENT_OTHER)
Admission: RE | Disposition: A | Discharge status: Home / Self Care | Payer: Self-pay | Source: Home / Self Care | Attending: Urology

## 2020-09-10 ENCOUNTER — Ambulatory Visit (HOSPITAL_BASED_OUTPATIENT_CLINIC_OR_DEPARTMENT_OTHER)
Admission: RE | Admit: 2020-09-10 | Discharge: 2020-09-10 | Disposition: A | Discharge status: Home / Self Care | Payer: BC Managed Care – PPO | Attending: Urology | Admitting: Urology

## 2020-09-10 ENCOUNTER — Encounter (HOSPITAL_BASED_OUTPATIENT_CLINIC_OR_DEPARTMENT_OTHER): Payer: Self-pay | Admitting: Urology

## 2020-09-10 ENCOUNTER — Ambulatory Visit (HOSPITAL_BASED_OUTPATIENT_CLINIC_OR_DEPARTMENT_OTHER): Payer: BC Managed Care – PPO | Admitting: Certified Registered Nurse Anesthetist

## 2020-09-10 DIAGNOSIS — Z20822 Contact with and (suspected) exposure to covid-19: Secondary | ICD-10-CM | POA: Diagnosis not present

## 2020-09-10 DIAGNOSIS — N202 Calculus of kidney with calculus of ureter: Secondary | ICD-10-CM | POA: Diagnosis not present

## 2020-09-10 DIAGNOSIS — I2510 Atherosclerotic heart disease of native coronary artery without angina pectoris: Secondary | ICD-10-CM | POA: Insufficient documentation

## 2020-09-10 DIAGNOSIS — Z955 Presence of coronary angioplasty implant and graft: Secondary | ICD-10-CM | POA: Insufficient documentation

## 2020-09-10 DIAGNOSIS — Z887 Allergy status to serum and vaccine status: Secondary | ICD-10-CM | POA: Insufficient documentation

## 2020-09-10 DIAGNOSIS — Z79899 Other long term (current) drug therapy: Secondary | ICD-10-CM | POA: Insufficient documentation

## 2020-09-10 HISTORY — DX: Gout, unspecified: M10.9

## 2020-09-10 HISTORY — DX: Atherosclerotic heart disease of native coronary artery without angina pectoris: I25.10

## 2020-09-10 HISTORY — PX: CYSTOSCOPY/URETEROSCOPY/HOLMIUM LASER/STENT PLACEMENT: SHX6546

## 2020-09-10 LAB — POCT I-STAT, CHEM 8
BUN: 16 mg/dL (ref 6–20)
Calcium, Ion: 1.29 mmol/L (ref 1.15–1.40)
Chloride: 108 mmol/L (ref 98–111)
Creatinine, Ser: 0.9 mg/dL (ref 0.61–1.24)
Glucose, Bld: 131 mg/dL — ABNORMAL HIGH (ref 70–99)
HCT: 46 % (ref 39.0–52.0)
Hemoglobin: 15.6 g/dL (ref 13.0–17.0)
Potassium: 4.2 mmol/L (ref 3.5–5.1)
Sodium: 142 mmol/L (ref 135–145)
TCO2: 23 mmol/L (ref 22–32)

## 2020-09-10 SURGERY — CYSTOSCOPY/URETEROSCOPY/HOLMIUM LASER/STENT PLACEMENT
Anesthesia: General | Site: Bladder | Laterality: Left

## 2020-09-10 MED ORDER — FENTANYL CITRATE (PF) 100 MCG/2ML IJ SOLN
INTRAMUSCULAR | Status: DC | PRN
  Administered 2020-09-10: 50 ug via INTRAVENOUS
  Administered 2020-09-10 (×2): 25 ug via INTRAVENOUS

## 2020-09-10 MED ORDER — MIDAZOLAM HCL 5 MG/5ML IJ SOLN
INTRAMUSCULAR | Status: DC | PRN
  Administered 2020-09-10: 2 mg via INTRAVENOUS

## 2020-09-10 MED ORDER — LACTATED RINGERS IV SOLN: INTRAVENOUS | Status: DC

## 2020-09-10 MED ORDER — PHENYLEPHRINE 40 MCG/ML (10ML) SYRINGE FOR IV PUSH (FOR BLOOD PRESSURE SUPPORT)
PREFILLED_SYRINGE | INTRAVENOUS | Status: DC | PRN
  Administered 2020-09-10 (×2): 120 ug via INTRAVENOUS

## 2020-09-10 MED ORDER — OXYCODONE HCL 5 MG PO TABS
5.0000 mg | ORAL_TABLET | Freq: Once | ORAL | Status: DC | PRN
Start: 2020-09-10 — End: 2020-09-10

## 2020-09-10 MED ORDER — OXYCODONE HCL 5 MG/5ML PO SOLN
5.0000 mg | Freq: Once | ORAL | Status: DC | PRN
Start: 2020-09-10 — End: 2020-09-10

## 2020-09-10 MED ORDER — SODIUM CHLORIDE 0.9 % IR SOLN
Status: DC | PRN
  Administered 2020-09-10: 3000 mL via INTRAVESICAL

## 2020-09-10 MED ORDER — TRAMADOL HCL 50 MG PO TABS: 50.0000 mg | ORAL_TABLET | Freq: Four times a day (QID) | ORAL | 0 refills | Status: DC | PRN

## 2020-09-10 MED ORDER — DEXAMETHASONE SODIUM PHOSPHATE 10 MG/ML IJ SOLN
INTRAMUSCULAR | Status: DC | PRN
  Administered 2020-09-10: 8 mg via INTRAVENOUS

## 2020-09-10 MED ORDER — CEFAZOLIN SODIUM-DEXTROSE 2-4 GM/100ML-% IV SOLN
2.0000 g | INTRAVENOUS | Status: AC
  Administered 2020-09-10: 2 g via INTRAVENOUS

## 2020-09-10 MED ORDER — PHENYLEPHRINE 40 MCG/ML (10ML) SYRINGE FOR IV PUSH (FOR BLOOD PRESSURE SUPPORT)
PREFILLED_SYRINGE | INTRAVENOUS | Status: AC
  Filled 2020-09-10: qty 10

## 2020-09-10 MED ORDER — MIDAZOLAM HCL 2 MG/2ML IJ SOLN
INTRAMUSCULAR | Status: AC
  Filled 2020-09-10: qty 2

## 2020-09-10 MED ORDER — ONDANSETRON HCL 4 MG/2ML IJ SOLN
INTRAMUSCULAR | Status: DC | PRN
  Administered 2020-09-10: 4 mg via INTRAVENOUS

## 2020-09-10 MED ORDER — AMISULPRIDE (ANTIEMETIC) 5 MG/2ML IV SOLN: 10.0000 mg | Freq: Once | INTRAVENOUS | Status: DC | PRN

## 2020-09-10 MED ORDER — FENTANYL CITRATE (PF) 100 MCG/2ML IJ SOLN
INTRAMUSCULAR | Status: AC
  Filled 2020-09-10: qty 2

## 2020-09-10 MED ORDER — ONDANSETRON HCL 4 MG/2ML IJ SOLN
INTRAMUSCULAR | Status: AC
  Filled 2020-09-10: qty 2

## 2020-09-10 MED ORDER — FENTANYL CITRATE (PF) 100 MCG/2ML IJ SOLN: 25.0000 ug | INTRAMUSCULAR | Status: DC | PRN

## 2020-09-10 MED ORDER — LIDOCAINE 2% (20 MG/ML) 5 ML SYRINGE
INTRAMUSCULAR | Status: AC
  Filled 2020-09-10: qty 5

## 2020-09-10 MED ORDER — ONDANSETRON HCL 4 MG/2ML IJ SOLN: 4.0000 mg | Freq: Once | INTRAMUSCULAR | Status: DC | PRN

## 2020-09-10 MED ORDER — LIDOCAINE 2% (20 MG/ML) 5 ML SYRINGE
INTRAMUSCULAR | Status: DC | PRN
  Administered 2020-09-10: 100 mg via INTRAVENOUS

## 2020-09-10 MED ORDER — DEXAMETHASONE SODIUM PHOSPHATE 10 MG/ML IJ SOLN
INTRAMUSCULAR | Status: AC
  Filled 2020-09-10: qty 1

## 2020-09-10 MED ORDER — PROPOFOL 10 MG/ML IV BOLUS
INTRAVENOUS | Status: AC
  Filled 2020-09-10: qty 20

## 2020-09-10 MED ORDER — IOHEXOL 300 MG/ML  SOLN
INTRAMUSCULAR | Status: DC | PRN
  Administered 2020-09-10: 12 mL via URETHRAL

## 2020-09-10 MED ORDER — PROPOFOL 10 MG/ML IV BOLUS
INTRAVENOUS | Status: DC | PRN
  Administered 2020-09-10: 150 mg via INTRAVENOUS

## 2020-09-10 MED ORDER — CEFAZOLIN SODIUM-DEXTROSE 2-4 GM/100ML-% IV SOLN
INTRAVENOUS | Status: AC
  Filled 2020-09-10: qty 100

## 2020-09-10 MED ORDER — KETOROLAC TROMETHAMINE 30 MG/ML IJ SOLN
INTRAMUSCULAR | Status: DC | PRN
  Administered 2020-09-10: 30 mg via INTRAVENOUS

## 2020-09-10 SURGICAL SUPPLY — 22 items
BAG DRAIN URO-CYSTO SKYTR STRL (DRAIN) ×3 IMPLANT
BASKET ZERO TIP NITINOL 2.4FR (BASKET) ×3 IMPLANT
CATH INTERMIT  6FR 70CM (CATHETERS) ×3 IMPLANT
CLOTH BEACON ORANGE TIMEOUT ST (SAFETY) ×3 IMPLANT
FIBER LASER FLEXIVA 365 (UROLOGICAL SUPPLIES) IMPLANT
GLOVE SURG ENC MOIS LTX SZ7 (GLOVE) ×3 IMPLANT
GLOVE SURG ENC MOIS LTX SZ7.5 (GLOVE) ×3 IMPLANT
GLOVE SURG UNDER POLY LF SZ7.5 (GLOVE) ×3 IMPLANT
GOWN STRL REUS W/TWL LRG LVL3 (GOWN DISPOSABLE) ×3 IMPLANT
GOWN STRL REUS W/TWL XL LVL3 (GOWN DISPOSABLE) ×3 IMPLANT
GUIDEWIRE STR DUAL SENSOR (WIRE) ×3 IMPLANT
IV NS IRRIG 3000ML ARTHROMATIC (IV SOLUTION) ×3 IMPLANT
KIT TURNOVER CYSTO (KITS) ×3 IMPLANT
MANIFOLD NEPTUNE II (INSTRUMENTS) ×3 IMPLANT
NS IRRIG 500ML POUR BTL (IV SOLUTION) IMPLANT
PACK CYSTO (CUSTOM PROCEDURE TRAY) ×3 IMPLANT
STENT URET 6FRX26 CONTOUR (STENTS) ×3 IMPLANT
TRACTIP FLEXIVA PULS ID 200XHI (Laser) ×1 IMPLANT
TRACTIP FLEXIVA PULSE ID 200 (Laser) ×2
TUBE CONNECTING 12'X1/4 (SUCTIONS)
TUBE CONNECTING 12X1/4 (SUCTIONS) IMPLANT
TUBING UROLOGY SET (TUBING) ×3 IMPLANT

## 2020-09-10 NOTE — Anesthesia Preprocedure Evaluation (Addendum)
Anesthesia Evaluation  Patient identified by MRN, date of birth, ID band Patient awake    Reviewed: Allergy & Precautions, NPO status , Patient's Chart, lab work & pertinent test results  History of Anesthesia Complications Negative for: history of anesthetic complications  Airway Mallampati: II  TM Distance: >3 FB Neck ROM: Full    Dental  (+) Teeth Intact   Pulmonary asthma , sleep apnea ,    Pulmonary exam normal        Cardiovascular hypertension, + CAD and + Cardiac Stents (2019)  Normal cardiovascular exam     Neuro/Psych negative neurological ROS  negative psych ROS   GI/Hepatic Neg liver ROS, GERD  ,  Endo/Other  negative endocrine ROS  Renal/GU Renal disease (left ureteral stone)  negative genitourinary   Musculoskeletal negative musculoskeletal ROS (+)   Abdominal   Peds  Hematology negative hematology ROS (+)   Anesthesia Other Findings   Reproductive/Obstetrics                           Anesthesia Physical Anesthesia Plan  ASA: III  Anesthesia Plan: General   Post-op Pain Management:    Induction: Intravenous  PONV Risk Score and Plan: 2 and Ondansetron, Dexamethasone, Midazolam and Treatment may vary due to age or medical condition  Airway Management Planned: LMA  Additional Equipment: None  Intra-op Plan:   Post-operative Plan: Extubation in OR  Informed Consent: I have reviewed the patients History and Physical, chart, labs and discussed the procedure including the risks, benefits and alternatives for the proposed anesthesia with the patient or authorized representative who has indicated his/her understanding and acceptance.     Dental advisory given  Plan Discussed with:   Anesthesia Plan Comments:         Anesthesia Quick Evaluation

## 2020-09-10 NOTE — Transfer of Care (Signed)
Immediate Anesthesia Transfer of Care Note  Patient: Hunter Krause  Procedure(s) Performed: CYSTOSCOPY LEFT URETEROSCOPY/HOLMIUM LASER/STENT PLACEMENT (Left Bladder)  Patient Location: PACU  Anesthesia Type:General  Level of Consciousness: drowsy  Airway & Oxygen Therapy: Patient Spontanous Breathing and Patient connected to nasal cannula oxygen  Post-op Assessment: Report given to RN  Post vital signs: Reviewed and stable  Last Vitals:  Vitals Value Taken Time  BP 145/71   Temp    Pulse 77 09/10/20 1319  Resp 9 09/10/20 1319  SpO2 100 % 09/10/20 1319  Vitals shown include unvalidated device data.  Last Pain:  Vitals:   09/10/20 1129  TempSrc: Oral  PainSc: 4       Patients Stated Pain Goal: 4 (09/10/20 1129)  Complications: No complications documented.

## 2020-09-10 NOTE — H&P (Signed)
CC/HPI: CC: Right flank pain   HPI: Hunter Krause is a 60 year old male with a history of kidney stones as well as CAD and is s/p coronary stent in 03/2018 (currently on Plavix).   03/01/2019: Presents today with complaints of pain related to KS. KUB at last OV showed bilateral non-obstructing KS. No evidence of hydronephrosis on the right side via u/s. He was started on HCTZ for stone prophylaxis. Symptoms began about 4-5 days ago with right lower back pain, now predominantly in the right lower quadrant described as constant and moderately severe. He continues tamsulosin. Associated with increased hesitancy and straining to void. He has not seen any stone material passed. Denies dysuria or gross hematuria. Denies fevers or chills. He has had some nausea but not vomiting.   03/15/2019: Returns today for follow-up. KUB somewhat inconclusive at last office visit for the presence of a definitive right ureteral calculi. Urine culture did result positive for 10,000 colonies Escherichia coli. He began treatment with doxycycline. The patient completed the entire course of the antibiotic and noted resolution of back and lower quadrant abdominal pain on the right side. No longer having nausea and no longer having any hesitancy or straining to void. He has not seen any interval stone material passage. Continues to not have any burning or painful urination, gross hematuria. Patient remains afebrile.   11.13.2020: He presents today feeling what he has is another KS on the right side. He has been followed rather aggressively for these and has had multiple 24 hr urines (he is about to complete another). He denies any fever or nausea but he has had blood per urine. He has been managing his pain with tramadol and Sprix (last taken at 11 am today). He denies any urinary changes.   06/28/19: The patient is here today for a routine follow-up. The patient was seen by Dr. Retta Diones in November for worsening right flank pain.  KUB at that time showed a possible 4 mm right distal ureteral calculus. He presents today reporting that he passed a small stone several weeks ago and denies residual flank pain, nausea, vomiting, dysuria or hematuria. UA is clear today. From a urinary standpoint, he continues to take tamsulosin once daily. He reports a good FOS and feels like he is emptying his bladder well. He has occasional urgency/frequency, but is not bothered by it. Nocturia x 1. Denies interval UTIs, dysuria or hematuria.   24 hour urine (06/07/2019)  -urine volume -3.24  -all the parameters were in the "decreased risk" category   05/23/2020: Here today for evaluation regarding possible kidney stone event. Symptoms began last night with sharp right lower back pain radiating into the right flank. Pain was severe with some associated nausea and increased urinary frequency/urgency. Not associated with burning or painful urination, visible blood in the urine. He continues tamsulosin daily. Patient had a previous prescription for Sprix that he used which helped reduce the severity of his symptoms until he could follow up this morning. Still having moderate discomfort mostly in the right flank. Symptoms not associated with fevers or chills.   08/27/2020: I have suspicion for an obstructing right proximal ureteral calculi seen on inverse imaging along the anatomical expected tract of the right proximal ureter at time of last OV. I plan for follow-up in 2 weeks from that visit but the appointment never occurred for unclear reasons. He tells me after that he passed a stone with symptom relief. Since then he has passed 3 additional stones  with only mild to moderate but fleeting pain and discomfort. Now today presenting for increased/worsening pain on the left side similar to previous exacerbations of stone events. Symptoms present now for approximately 3 days. Pain only moderately controlled with previously prescribed tramadol as well as  ketorolac. He does have an associated mild increase in bothersome urgency as well as some increased hesitancy and starting/stopping of his stream. He had hematuria at the onset of symptoms but has not seen any today. Denies burning or painful urination. No associated fevers or chills, nausea/vomiting.     ALLERGIES: flu shot Spiriva HandiHaler CAPS    MEDICATIONS: Allopurinol 300 mg tablet 1 tablet PO Daily  Lipitor  Sprix 15.75 mg/spray spray, non-aerosol 1 squirt to each nostril Q8 hours PRN  Tamsulosin Hcl 0.4 mg capsule TAKE 1 CAPSULE AT BEDTIME  Albuterol Sulfate 2.5 mg/3 ml (0.083 %) vial, nebulizer 0 Inhalation  Aspir 81  Azelastine HCl - 0.1 % Nasal Solution Nasal  Fluticasone Propionate 50 mcg/actuation spray, suspension Nasal  Qvar 80 mcg aerosol with adapter Inhalation  Striverdi Respimat 2.5 mcg/actuation mist inhaler Inhalation  Tramadol Hcl 50 mg tablet 1-2 tablet PO Q 6 H PRN  Tramadol Hcl 50 mg tablet 1-2 tablet PO Q 6 H PRN  Zetia 10 mg tablet Oral  Zyflo 600 mg tablet Oral     GU PSH: ESWL - 2015, 2014       PSH Notes: Cystoscopy With Fragmentation Of Ureteral Calculus, Lithotripsy, Lithotripsy, Knee Surgery   NON-GU PSH: Cardiac Stent Placement - 03/30/2018 Cystoscopy; Stone Remove - 2016     GU PMH: Flank Pain - 05/23/2020, - 2020 History of urolithiasis - 05/23/2020, Nephrolithiasis, - 2014 Ureteral calculus (Stable) - 05/23/2020, - 12/03/2018, Right, 3 mm right UPJ w/o hydro, - 11/09/2018 Encounter for Prostate Cancer screening (Stable) - 06/28/2019, Prostate cancer screening, - 2014 Urinary Urgency - 06/28/2019 Renal and ureteral calculus (Stable), Right, He feels as though he is symptomatic of a right sided KS. CT today shows possible stone in right distal ureter (3-4 mm in size). - 05/13/2019, - 12/02/2018 Acute Cystitis/UTI (Improving) - 03/15/2019 Microscopic hematuria - 12/03/2018 Renal calculus - 12/03/2018, - 2018, Bilateral kidney stones, - 2017 Renal  colic - 04/01/2018 BPH w/LUTS - 2018 Family Hx of Prostate Cancer - 2018 Urinary Frequency - 2018 RLQ pain, Abdominal pain, RLQ (right lower quadrant) - 2016 Abdominal Pain Unspec, Acute left flank pain - 2016, Right flank pain, - 2016 LLQ pain, Abdominal pain, LLQ (left lower quadrant) - 2016 RUQ pain, Abdominal pain, RUQ (right upper quadrant) - 2016 Dysuria, Dysuria - 2015 Gross hematuria, Gross hematuria - 2015 Other microscopic hematuria, Microscopic hematuria - 2015 Low back pain, Lower back pain - 2014 Lower abdominal pain, unspecified, Lower abdominal pain - 2014 Urinary Retention, Unspec, Incomplete bladder emptying - 2014    NON-GU PMH: Encounter for general adult medical examination without abnormal findings, Encounter for preventive health examination - 2017 Diverticulosis, Diverticulosis - 2014 Diverticulosis of large intestine without perforation or abscess without bleeding, Diverticulosis of colon - 2014 Irritable bowel syndrome with diarrhea, Irritable Bowel Syndrome - 2014 Personal history of other diseases of the digestive system, History of esophageal reflux - 2014    FAMILY HISTORY: Death In The Family Father - Runs In Family Death In The Family Mother - Runs In Family Diabetes - Mother Family Health Status Number - Mother Heart Disease - Runs In Family, Father Ischemic Stroke - Mother nephrolithiasis - Father, Runs In Family Prostate Cancer -  Runs In Family Stroke Syndrome - Mother   SOCIAL HISTORY: Marital Status: Married Preferred Language: English; Ethnicity: Not Hispanic Or Latino; Race: White Current Smoking Status: Patient has never smoked.  Social Drinker.  Drinks 2 caffeinated drinks per day. Patient's occupation Product/process development scientist.     Notes: Never A Smoker, Tobacco Use, Caffeine Use, Alcohol Use, Marital History - Currently Married   REVIEW OF SYSTEMS:    GU Review Male:   Patient reports frequent urination, trouble starting your stream, and have  to strain to urinate . Patient denies hard to postpone urination, burning/ pain with urination, get up at night to urinate, leakage of urine, stream starts and stops, erection problems, and penile pain.  Gastrointestinal (Upper):   Patient denies nausea, vomiting, and indigestion/ heartburn.  Gastrointestinal (Lower):   Patient denies diarrhea and constipation.  Constitutional:   Patient denies fever, night sweats, weight loss, and fatigue.  Skin:   Patient denies skin rash/ lesion and itching.  Eyes:   Patient denies blurred vision and double vision.  Ears/ Nose/ Throat:   Patient denies sore throat and sinus problems.  Hematologic/Lymphatic:   Patient denies swollen glands and easy bruising.  Cardiovascular:   Patient denies leg swelling and chest pains.  Respiratory:   Patient denies cough and shortness of breath.  Endocrine:   Patient denies excessive thirst.  Musculoskeletal:   Patient reports back pain. Patient denies joint pain.  Neurological:   Patient denies headaches and dizziness.  Psychologic:   Patient denies anxiety and depression.   VITAL SIGNS:      08/27/2020 03:13 PM  Weight 230 lb / 104.33 kg  BP 117/77 mmHg  Pulse 101 /min  Temperature 98.2 F / 36.7 C   MULTI-SYSTEM PHYSICAL EXAMINATION:    Constitutional: Well-nourished. No physical deformities. Normally developed. Good grooming. Appears mildly uncomfortable.  Neck: Neck symmetrical, not swollen. Normal tracheal position.  Respiratory: No labored breathing, no use of accessory muscles.   Cardiovascular: Normal temperature, normal extremity pulses, no swelling, no varicosities.  Skin: No paleness, no jaundice, no cyanosis. No lesion, no ulcer, no rash.  Neurologic / Psychiatric: Oriented to time, oriented to place, oriented to person. No depression, no anxiety, no agitation.  Gastrointestinal: Obese abdomen. No mass, no tenderness, no rigidity. NO CVA or flank tenderness.  Musculoskeletal: Normal gait and station of  head and neck.     Complexity of Data:  Source Of History:  Patient, Medical Record Summary  Records Review:   Previous Doctor Records, Previous Hospital Records, Previous Patient Records  Urine Test Review:   Urinalysis, Urine Culture  X-Ray Review: KUB: Reviewed Films. Discussed With Patient.     06/28/19 07/30/10 03/09/07  PSA  Total PSA 1.40 ng/mL 0.78  0.76     08/27/20  Urinalysis  Urine Appearance Cloudy   Urine Color Red   Urine Glucose 1+ mg/dL  Urine Bilirubin Neg mg/dL  Urine Ketones Neg mg/dL  Urine Specific Gravity 1.020   Urine Blood 3+ ery/uL  Urine pH 5.5   Urine Protein 1+ mg/dL  Urine Urobilinogen 0.2 mg/dL  Urine Nitrites Neg   Urine Leukocyte Esterase Trace leu/uL  Urine WBC/hpf 0 - 5/hpf   Urine RBC/hpf >60/hpf   Urine Epithelial Cells NS (Not Seen)   Urine Bacteria Rare (0-9/hpf)   Urine Mucous Present   Urine Yeast NS (Not Seen)   Urine Trichomonas Not Present   Urine Cystals NS (Not Seen)   Urine Casts NS (Not Seen)   Urine  Sperm Not Present    PROCEDURES:         KUB - [id number]  A single view of the abdomen is obtained. Numerous bilateral nonobstructing calculi visualized within the confines of each renal shadow. A new finding on today's exam, there is 2-3 opacities consistent with obstructing ureteral calculi noted along the anatomical expected tract of the left proximal ureter. Opacities measuring around 6 mm at greatest width but are approximately 2 cm at greatest length. Again the opacities appear to be separate calculi that are stacked upon each other along the proximal ureter's anatomical expected course. No additional opacities identified that suggest additional ureteral calculi bilaterally. Stable left-sided pelvic phlebolith grossly remains unchanged. Bowel gas and stool pattern appears within normal limits. Bladder grossly appears free of obstruction. Stable appearing lumbar spinal fixation hardware grossly remains intact and in place.      .  Patient confirmed No Neulasta OnPro Device.           Urinalysis w/Scope Dipstick Dipstick Cont'd Micro  Color: Red Bilirubin: Neg mg/dL WBC/hpf: 0 - 5/hpf  Appearance: Cloudy Ketones: Neg mg/dL RBC/hpf: >84/ONG  Specific Gravity: 1.020 Blood: 3+ ery/uL Bacteria: Rare (0-9/hpf)  pH: 5.5 Protein: 1+ mg/dL Cystals: NS (Not Seen)  Glucose: 1+ mg/dL Urobilinogen: 0.2 mg/dL Casts: NS (Not Seen)    Nitrites: Neg Trichomonas: Not Present    Leukocyte Esterase: Trace leu/uL Mucous: Present      Epithelial Cells: NS (Not Seen)      Yeast: NS (Not Seen)      Sperm: Not Present         Ketoralac 60mg  - , [id number] Skin was prepped with alcohol. Site was injected. Pt tolerated well.   Qty: 60 Adm. By: [id number]  Unit: mg Lot No Tilda Burrow  Route: IM Exp. Date 06/30/2021  Freq: None Mfgr.:   Site: Left Buttock   ASSESSMENT:      ICD-10 Details  1 GU:   Flank Pain - R10.84 Left, Acute, Systemic Symptoms  2   Renal calculus - N20.0 Bilateral, Chronic, Stable  3   Ureteral calculus - N20.1 Left, Acute, Systemic Symptoms   PLAN:            Medications New Meds: Hydrocodone-Acetaminophen 5 mg-325 mg tablet 1 tablet PO Q 6 H PRN   #20  0 Refill(s)    Stop Meds: Hydrochlorothiazide 12.5 mg capsule Oral  Start: 08/28/2014  Discontinue: 08/27/2020  - Reason: The medication cycle was completed.            Orders Labs Urine Culture  X-Rays: KUB          Schedule Return Visit/Planned Activity: ASAP - Schedule Surgery  Procedure: 08/27/2020 at [facility] - [phone] - Ketoralac 60mg  (Toradol Per 15 Mg) - [id number], [phone]          Document Letter(s):  Created for Patient: Clinical Summary         Notes:   KUB today shows at least 2 possibly 3 stones stacked on top of 1 another along the left proximal ureter. Greatest width is 6 mm with greatest length of the total collection measuring around 2 cm. Given the uniform width from top to bottom, I am not sure if shockwave  lithotripsy would be a good option based on size alone. I also have concerns about him being able to manage pain and discomfort to allow for interval stone material passage. I'll have to discuss with the other urologist's in clinic  due to his primary provider being temporarily absent.   I went ahead and repeated discussion regarding shockwave lithotripsy and ureteroscopy in detail today.   For ureteroscopy I described the risks which include heart attack, stroke, pulmonary embolus, death, bleeding, infection, damage to contiguous structures, positioning injury, ureteral stricture, ureteral avulsion, ureteral injury, need for ureteral stent, inability to perform ureteroscopy, need for an interval procedure, inability to clear stone burden, stent discomfort and pain.   For shockwave lithotripsy I described the risks which include arrhythmia, kidney contusion, kidney hemorrhage, need for transfusion, long-term risk of diabetes or hypertension, back discomfort, flank ecchymosis, flank abrasion, inability to break up stone, inability to pass stone fragments, Steinstrasse, infection associated with obstructing stones, need for different surgical procedure and possible need for repeat shockwave lithotripsy.    He will continue tamsulosin, I sent in a prescription for Vicodin, he also received an injection of Toradol here in clinic which historically has been effective in managing his severe pain. Urine culture sent today and if indicated appropriate antimicrobial treatment will be prescribed. With worsening severity of pain and discomfort, new or worsening lower urinary tract symptoms including painful inability to void, uncontrollable fevers/chills or uncontrollable nausea/vomiting he understands to seek immediate emergency department evaluation or contact the office to schedule a follow-up appointment. Once I have discussed with some of the other providers here in clinic, the patient will be contacted and  scheduled appropriately. In the event he does start passed stone material, the patient will let the office know and will be scheduled for appropriate follow-up.

## 2020-09-10 NOTE — Op Note (Signed)
Operative Note  Preoperative diagnosis:  1.  Left ureteral calculi  Postoperative diagnosis: 1.  Left ureteral calculi  Procedure(s): 1.  Cystoscopy with left retrograde pyelogram, left ureteroscopy with laser lithotripsy and stone basketing, ureteral stent placement  Surgeon: Modena Slater, MD  Assistants: None  Anesthesia: General  Complications: None immediate  EBL: Minimal  Specimens: 1.  Renal calculi  Drains/Catheters: 1.  6 x 26 double-J ureteral stent  Intraoperative findings: 1.  Normal anterior urethra 2.  Borderline obstructing prostate 3.  3 stacked distal ureteral calculi that were fragmented and basket extracted.  Afterwards retrograde pyelogram revealed mild hydronephrosis with no filling defect.  Indication: 60 year old male with left ureteral calculi presents for the previously mentioned operation.  He was previously scheduled for ESWL but could not come off aspirin due to previous stent placement.  At the time the stones were too high and close to the kidney to perform ESWL.  Therefore, he presents for the previously mentioned operation.  Description of procedure:  The patient was identified and consent was obtained.  The patient was taken to the operating room and placed in the supine position.  The patient was placed under general anesthesia.  Perioperative antibiotics were administered.  The patient was placed in dorsal lithotomy.  Patient was prepped and draped in a standard sterile fashion and a timeout was performed.  A 21 French rigid cystoscope was advanced into the urethra and into the bladder.  Complete cystoscopy was performed with no abnormal findings.  The left ureter was cannulated with a sensor wire which was advanced up to the kidney under fluoroscopic guidance.  Semirigid ureteroscopy was performed and the stone fragments were fragmented to smaller fragments.  There were then basket extracted.  Many of them were collected for specimen.  I then  advanced the scope into the ureter and up to the kidney and did not see any other stones.  There was a fair amount of edema at the level of stone impaction and laser lithotripsy site.  There is no obvious injury.  Retrograde pyelogram was performed through the scope with the findings noted above.  I then backloaded the wire onto the rigid cystoscope and advanced that into the bladder followed by routine placement of a 6 x 26 double-J ureteral stent.  Fluoroscopy confirmed proximal placement and direct visualization confirmed a good coil within the bladder.  I drained the bladder and withdrew the scope.  Patient tolerated procedure well and was stable postoperative.  Plan: Follow-up in 1 week for stent removal

## 2020-09-10 NOTE — Interval H&P Note (Signed)
History and Physical Interval Note:  09/10/2020 11:45 AM  Hunter Krause  has presented today for surgery, with the diagnosis of LEFT URETERAL STONE.  The various methods of treatment have been discussed with the patient and family. After consideration of risks, benefits and other options for treatment, the patient has consented to  Procedure(s): CYSTOSCOPY LEFT URETEROSCOPY/HOLMIUM LASER/STENT PLACEMENT (Left) as a surgical intervention.  The patient's history has been reviewed, patient examined, no change in status, stable for surgery.  I have reviewed the patient's chart and labs.  Questions were answered to the patient's satisfaction.    Patient was not cleared by his primary care physician to stop aspirin 81 mg.  Therefore, he was not eligible for ESWL.  Therefore, he elected for the above operation.   Ray Church, III

## 2020-09-10 NOTE — Discharge Instructions (Signed)
Post Anesthesia Home Care Instructions  Activity: Get plenty of rest for the remainder of the day. A responsible individual must stay with you for 24 hours following the procedure.  For the next 24 hours, DO NOT: -Drive a car -Advertising copywriter -Drink alcoholic beverages -Take any medication unless instructed by your physician -Make any legal decisions or sign important papers.  Meals: Start with liquid foods such as gelatin or soup. Progress to regular foods as tolerated. Avoid greasy, spicy, heavy foods. If nausea and/or vomiting occur, drink only clear liquids until the nausea and/or vomiting subsides. Call your physician if vomiting continues.  Special Instructions/Symptoms: Your throat may feel dry or sore from the anesthesia or the breathing tube placed in your throat during surgery. If this causes discomfort, gargle with warm salt water. The discomfort should disappear within 24 hours.  If you had a scopolamine patch placed behind your ear for the management of post- operative nausea and/or vomiting:  1. The medication in the patch is effective for 72 hours, after which it should be removed.  Wrap patch in a tissue and discard in the trash. Wash hands thoroughly with soap and water. 2. You may remove the patch earlier than 72 hours if you experience unpleasant side effects which may include dry mouth, dizziness or visual disturbances. 3. Avoid touching the patch. Wash your hands with soap and water after contact with the patch.    No ibuprofen, Advil, Aleve, Motrin, or naproxen until after 6:30 pm today if needed.   Ureteral Stent Implantation, Care After This sheet gives you information about how to care for yourself after your procedure. Your health care provider may also give you more specific instructions. If you have problems or questions, contact your health care provider. What can I expect after the procedure? After the procedure, it is common to have:  Nausea.  Mild  pain when you urinate. You may feel this pain in your lower back or lower abdomen. The pain should stop within a few minutes after you urinate. This may last for up to 1 week.  A small amount of blood in your urine for several days. Follow these instructions at home: Medicines  Take over-the-counter and prescription medicines only as told by your health care provider.  If you were prescribed an antibiotic medicine, take it as told by your health care provider. Do not stop taking the antibiotic even if you start to feel better.  Do not drive for 24 hours if you were given a sedative during your procedure.  Ask your health care provider if the medicine prescribed to you requires you to avoid driving or using heavy machinery. Activity  Rest as told by your health care provider.  Avoid sitting for a long time without moving. Get up to take short walks every 1-2 hours. This is important to improve blood flow and breathing. Ask for help if you feel weak or unsteady.  Return to your normal activities as told by your health care provider. Ask your health care provider what activities are safe for you. General instructions  Watch for any blood in your urine. Call your health care provider if the amount of blood in your urine increases.  If you have a catheter: ? Follow instructions from your health care provider about taking care of your catheter and collection bag. ? Do not take baths, swim, or use a hot tub until your health care provider approves. Ask your health care provider if you may take showers.  You may only be allowed to take sponge baths.  Drink enough fluid to keep your urine pale yellow.  Do not use any products that contain nicotine or tobacco, such as cigarettes, e-cigarettes, and chewing tobacco. These can delay healing after surgery. If you need help quitting, ask your health care provider.  Keep all follow-up visits as told by your health care provider. This is important.    Contact a health care provider if:  You have pain that gets worse or does not get better with medicine, especially pain when you urinate.  You have difficulty urinating.  You feel nauseous or you vomit repeatedly during a period of more than 2 days after the procedure. Get help right away if:  Your urine is dark red or has blood clots in it.  You are leaking urine (have incontinence).  The end of the stent comes out of your urethra.  You cannot urinate.  You have sudden, sharp, or severe pain in your abdomen or lower back.  You have a fever.  You have swelling or pain in your legs.  You have difficulty breathing. Summary  After the procedure, it is common to have mild pain when you urinate that goes away within a few minutes after you urinate. This may last for up to 1 week.  Watch for any blood in your urine. Call your health care provider if the amount of blood in your urine increases.  Take over-the-counter and prescription medicines only as told by your health care provider.  Drink enough fluid to keep your urine pale yellow. This information is not intended to replace advice given to you by your health care provider. Make sure you discuss any questions you have with your health care provider. Document Revised: 03/23/2018 Document Reviewed: 03/24/2018 Elsevier Patient Education  2021 [employer].

## 2020-09-10 NOTE — Anesthesia Postprocedure Evaluation (Signed)
Anesthesia Post Note  Patient: JEFF FRIEDEN  Procedure(s) Performed: CYSTOSCOPY LEFT URETEROSCOPY/HOLMIUM LASER/STENT PLACEMENT (Left Bladder)     Patient location during evaluation: PACU Level of consciousness: awake and alert Pain management: pain level controlled Vital Signs Assessment: post-procedure vital signs reviewed and stable Respiratory status: spontaneous breathing, nonlabored ventilation and respiratory function stable Cardiovascular status: blood pressure returned to baseline and stable Postop Assessment: no apparent nausea or vomiting Anesthetic complications: no   No complications documented.  Last Vitals:  Vitals:   09/10/20 1345 09/10/20 1415  BP: 119/64 131/75  Pulse: 71 70  Resp: 13 14  Temp:  37 C  SpO2: 97% 99%    Last Pain:  Vitals:   09/10/20 1345  TempSrc:   PainSc: 0-No pain                 Lucretia Kern

## 2020-09-10 NOTE — Anesthesia Procedure Notes (Signed)
Procedure Name: LMA Insertion Date/Time: 09/10/2020 12:20 PM Performed by: Briant Sites, CRNA Pre-anesthesia Checklist: Patient identified, Emergency Drugs available, Suction available and Patient being monitored Patient Re-evaluated:Patient Re-evaluated prior to induction Oxygen Delivery Method: Circle system utilized Preoxygenation: Pre-oxygenation with 100% oxygen Induction Type: IV induction Ventilation: Mask ventilation without difficulty LMA: LMA inserted LMA Size: 5.0 Number of attempts: 1 Airway Equipment and Method: Bite block Placement Confirmation: positive ETCO2 Tube secured with: Tape Dental Injury: Teeth and Oropharynx as per pre-operative assessment

## 2020-09-11 ENCOUNTER — Encounter (HOSPITAL_BASED_OUTPATIENT_CLINIC_OR_DEPARTMENT_OTHER): Payer: Self-pay | Admitting: Urology

## 2021-12-01 DIAGNOSIS — K922 Gastrointestinal hemorrhage, unspecified: Secondary | ICD-10-CM

## 2021-12-01 HISTORY — DX: Gastrointestinal hemorrhage, unspecified: K92.2

## 2022-01-28 ENCOUNTER — Other Ambulatory Visit: Payer: Self-pay | Admitting: Urology

## 2022-02-11 ENCOUNTER — Encounter (HOSPITAL_BASED_OUTPATIENT_CLINIC_OR_DEPARTMENT_OTHER): Payer: Self-pay | Admitting: Urology

## 2022-02-14 ENCOUNTER — Encounter (HOSPITAL_BASED_OUTPATIENT_CLINIC_OR_DEPARTMENT_OTHER): Payer: Self-pay | Admitting: Urology

## 2022-02-18 NOTE — Progress Notes (Signed)
Spoke with patient, pt moving surgery to a future date due to hemoglbin is 9.8.pt given surgery  scheduler office number to call to reschedule surgery.

## 2022-02-21 ENCOUNTER — Ambulatory Visit (HOSPITAL_BASED_OUTPATIENT_CLINIC_OR_DEPARTMENT_OTHER): Admission: RE | Admit: 2022-02-21 | Payer: BC Managed Care – PPO | Source: Home / Self Care | Admitting: Urology

## 2022-02-21 HISTORY — DX: Type 2 diabetes mellitus without complications: E11.9

## 2022-02-21 HISTORY — DX: Calculus of kidney: N20.0

## 2022-02-21 SURGERY — CYSTOSCOPY/URETEROSCOPY/HOLMIUM LASER/STENT PLACEMENT
Anesthesia: General | Laterality: Bilateral

## 2022-03-25 ENCOUNTER — Other Ambulatory Visit: Payer: Self-pay | Admitting: Urology

## 2022-04-21 IMAGING — DX DG ABDOMEN 1V
1 series · 1 of 1 positions shown · non-contrast
Comparison: 08/27/2020.

CLINICAL DATA: Left ureteral calculus

EXAM:
ABDOMEN - 1 VIEW

[abdomen kub]
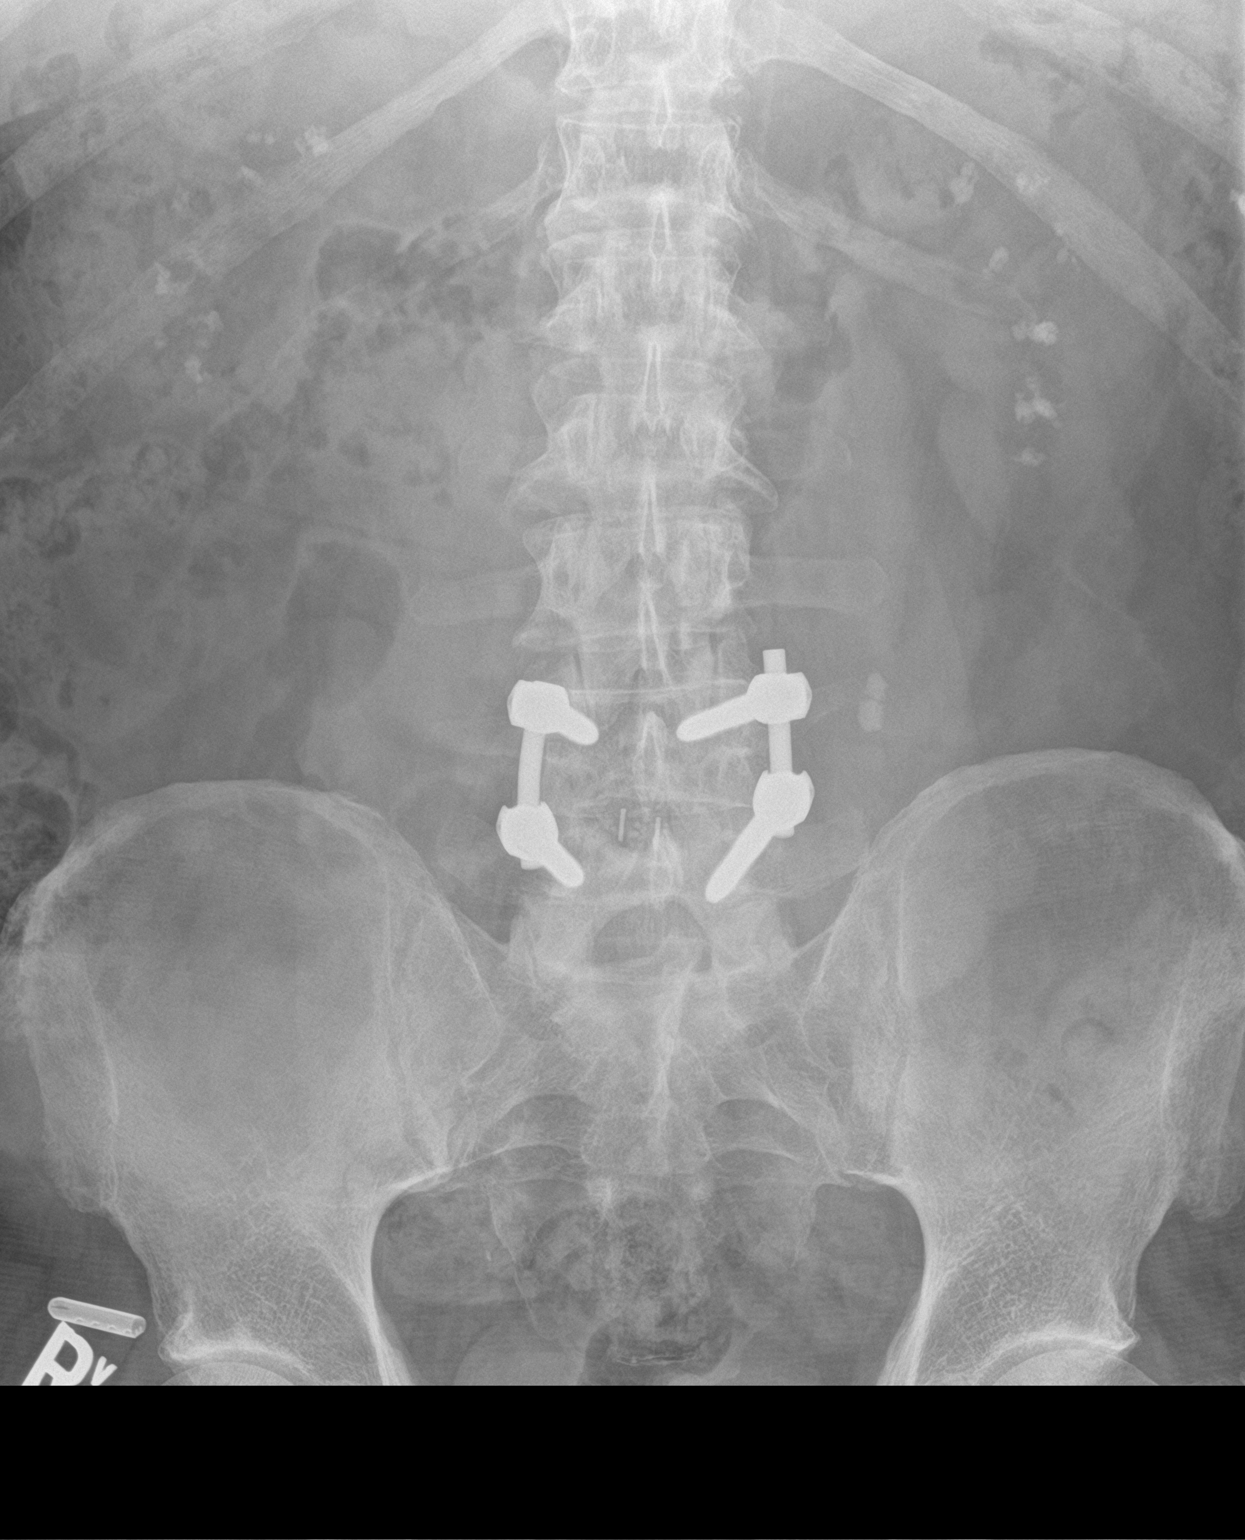

[1 of 1 positions shown; findings below may reference images not displayed]

FINDINGS: Extensive bilateral nephrolithiasis. Two or 3 calculi in the left
ureter at the L4 level similar to the prior study. Additional
calcification lateral to the calculi on the prior study no longer
visualized and likely outside of the ureter. No bladder calculi.

Lumbar fusion at L4-5.  Normal bowel gas pattern.
IMPRESSION: Extensive bilateral nephrolithiasis

2 or 3 calculi in the mid left ureter at the L4 level unchanged in
location.

## 2022-05-19 NOTE — Patient Instructions (Signed)
SURGICAL WAITING ROOM VISITATION Patients having surgery or a procedure may have no more than 2 support people in the waiting area - these visitors may rotate.   Children under the age of 66 must have an adult with them who is not the patient. If the patient needs to stay at the hospital during part of their recovery, the visitor guidelines for inpatient rooms apply. Pre-op nurse will coordinate an appropriate time for 1 support person to accompany patient in pre-op.  This support person may not rotate.    Please refer to the [facility] website for the visitor guidelines for Inpatients (after your surgery is over and you are in a regular room).       Your procedure is scheduled on:  05/28/22    Report to [facility] Main Entrance    Report to admitting at   [phone]   Call this number if you have problems the morning of surgery [phone]   Do not eat food :After Midnight.   After Midnight you may have the following liquids until __ 0430____ AM  DAY OF SURGERY  Water Non-Citrus Juices (without pulp, NO RED) Carbonated Beverages Black Coffee (NO MILK/CREAM OR CREAMERS, sugar ok)  Clear Tea (NO MILK/CREAM OR CREAMERS, sugar ok) regular and decaf                             Plain Jell-O (NO RED)                                           Fruit ices (not with fruit pulp, NO RED)                                     Popsicles (NO RED)                                                               Sports drinks like Gatorade (NO RED)                      If you have questions, please contact your surgeon's office.       Oral Hygiene is also important to reduce your risk of infection.                                    Remember - BRUSH YOUR TEETH THE MORNING OF SURGERY WITH YOUR REGULAR TOOTHPASTE   Do NOT smoke after Midnight   Take these medicines the morning of surgery with A SIP OF WATER:  inhalers as usual and bring, nebulizer if needed, allopurinol, atenolol, eye  drops as usual , protonix, spiriva   DO NOT TAKE ANY ORAL DIABETIC MEDICATIONS DAY OF YOUR SURGERY  Bring CPAP mask and tubing day of surgery.                              You may not have any  metal on your body including hair pins, jewelry, and body piercing             Do not wear make-up, lotions, powders, perfumes/cologne, or deodorant  Do not wear nail polish including gel and S&S, artificial/acrylic nails, or any other type of covering on natural nails including finger and toenails. If you have artificial nails, gel coating, etc. that needs to be removed by a nail salon please have this removed prior to surgery or surgery may need to be canceled/ delayed if the surgeon/ anesthesia feels like they are unable to be safely monitored.   Do not shave  48 hours prior to surgery.               Men may shave face and neck.   Do not bring valuables to the hospital. [facility] IS NOT             RESPONSIBLE   FOR VALUABLES.   Contacts, dentures or bridgework may not be worn into surgery.   Bring small overnight bag day of surgery.   DO NOT BRING YOUR HOME MEDICATIONS TO THE HOSPITAL. PHARMACY WILL DISPENSE MEDICATIONS LISTED ON YOUR MEDICATION LIST TO YOU DURING YOUR ADMISSION IN THE HOSPITAL!    Patients discharged on the day of surgery will not be allowed to drive home.  Someone NEEDS to stay with you for the first 24 hours after anesthesia.   Special Instructions: Bring a copy of your healthcare power of attorney and living will documents the day of surgery if you haven't scanned them before.              Please read over the following fact sheets you were given: IF YOU HAVE QUESTIONS ABOUT YOUR PRE-OP INSTRUCTIONS PLEASE CALL [phone]   If you received a COVID test during your pre-op visit  it is requested that you wear a mask when out in public, stay away from anyone that may not be feeling well and notify your surgeon if you develop symptoms. If you test positive for Covid or  have been in contact with anyone that has tested positive in the last 10 days please notify you surgeon.    [facility] - Preparing for Surgery Before surgery, you can play an important role.  Because skin is not sterile, your skin needs to be as free of germs as possible.  You can reduce the number of germs on your skin by washing with CHG (chlorahexidine gluconate) soap before surgery.  CHG is an antiseptic cleaner which kills germs and bonds with the skin to continue killing germs even after washing. Please DO NOT use if you have an allergy to CHG or antibacterial soaps.  If your skin becomes reddened/irritated stop using the CHG and inform your nurse when you arrive at Short Stay. Do not shave (including legs and underarms) for at least 48 hours prior to the first CHG shower.  You may shave your face/neck. Please follow these instructions carefully:  1.  Shower with CHG Soap the night before surgery and the  morning of Surgery.  2.  If you choose to wash your hair, wash your hair first as usual with your  normal  shampoo.  3.  After you shampoo, rinse your hair and body thoroughly to remove the  shampoo.                           4.  Use CHG as you  would any other liquid soap.  You can apply chg directly  to the skin and wash                       Gently with a scrungie or clean washcloth.  5.  Apply the CHG Soap to your body ONLY FROM THE NECK DOWN.   Do not use on face/ open                           Wound or open sores. Avoid contact with eyes, ears mouth and genitals (private parts).                       Wash face,  Genitals (private parts) with your normal soap.             6.  Wash thoroughly, paying special attention to the area where your surgery  will be performed.  7.  Thoroughly rinse your body with warm water from the neck down.  8.  DO NOT shower/wash with your normal soap after using and rinsing off  the CHG Soap.                9.  Pat yourself dry with a clean towel.             10.  Wear clean pajamas.            11.  Place clean sheets on your bed the night of your first shower and do not  sleep with pets. Day of Surgery : Do not apply any lotions/deodorants the morning of surgery.  Please wear clean clothes to the hospital/surgery center.  FAILURE TO FOLLOW THESE INSTRUCTIONS MAY RESULT IN THE CANCELLATION OF YOUR SURGERY PATIENT SIGNATURE_________________________________  NURSE SIGNATURE__________________________________  ________________________________________________________________________

## 2022-05-21 ENCOUNTER — Encounter (HOSPITAL_COMMUNITY)
Admission: RE | Admit: 2022-05-21 | Discharge: 2022-05-21 | Disposition: A | Discharge status: Home / Self Care | Payer: BC Managed Care – PPO | Source: Ambulatory Visit | Attending: Urology | Admitting: Urology

## 2022-05-21 ENCOUNTER — Other Ambulatory Visit: Payer: Self-pay

## 2022-05-21 ENCOUNTER — Encounter (HOSPITAL_COMMUNITY): Payer: Self-pay

## 2022-05-21 VITALS — BP 148/87 | HR 68 | Temp 98.7°F | Resp 16 | Ht 69.0 in | Wt 222.0 lb

## 2022-05-21 DIAGNOSIS — G4730 Sleep apnea, unspecified: Secondary | ICD-10-CM | POA: Insufficient documentation

## 2022-05-21 DIAGNOSIS — Z955 Presence of coronary angioplasty implant and graft: Secondary | ICD-10-CM | POA: Diagnosis not present

## 2022-05-21 DIAGNOSIS — Z01812 Encounter for preprocedural laboratory examination: Secondary | ICD-10-CM | POA: Insufficient documentation

## 2022-05-21 DIAGNOSIS — I2510 Atherosclerotic heart disease of native coronary artery without angina pectoris: Secondary | ICD-10-CM | POA: Insufficient documentation

## 2022-05-21 DIAGNOSIS — E119 Type 2 diabetes mellitus without complications: Secondary | ICD-10-CM | POA: Diagnosis not present

## 2022-05-21 DIAGNOSIS — Z01818 Encounter for other preprocedural examination: Secondary | ICD-10-CM | POA: Diagnosis present

## 2022-05-21 DIAGNOSIS — I10 Essential (primary) hypertension: Secondary | ICD-10-CM | POA: Insufficient documentation

## 2022-05-21 HISTORY — DX: Anemia, unspecified: D64.9

## 2022-05-21 HISTORY — DX: Unspecified osteoarthritis, unspecified site: M19.90

## 2022-05-21 HISTORY — DX: Personal history of urinary calculi: Z87.442

## 2022-05-21 LAB — CBC
HCT: 42.8 % (ref 39.0–52.0)
Hemoglobin: 12 g/dL — ABNORMAL LOW (ref 13.0–17.0)
MCH: 23.4 pg — ABNORMAL LOW (ref 26.0–34.0)
MCHC: 28 g/dL — ABNORMAL LOW (ref 30.0–36.0)
MCV: 83.6 fL (ref 80.0–100.0)
Platelets: 256 10*3/uL (ref 150–400)
RBC: 5.12 MIL/uL (ref 4.22–5.81)
RDW: 21.2 % — ABNORMAL HIGH (ref 11.5–15.5)
WBC: 10.8 10*3/uL — ABNORMAL HIGH (ref 4.0–10.5)
nRBC: 0 % (ref 0.0–0.2)

## 2022-05-21 LAB — BASIC METABOLIC PANEL
Anion gap: 9 (ref 5–15)
BUN: 17 mg/dL (ref 8–23)
CO2: 24 mmol/L (ref 22–32)
Calcium: 9.4 mg/dL (ref 8.9–10.3)
Chloride: 108 mmol/L (ref 98–111)
Creatinine, Ser: 1.04 mg/dL (ref 0.61–1.24)
GFR, Estimated: >60 mL/min (ref >60)
Glucose, Bld: 105 mg/dL — ABNORMAL HIGH (ref 70–99)
Potassium: 4.3 mmol/L (ref 3.5–5.1)
Sodium: 141 mmol/L (ref 135–145)

## 2022-05-21 LAB — GLUCOSE, CAPILLARY: Glucose-Capillary: 96 mg/dL (ref 70–99)

## 2022-05-21 LAB — HEMOGLOBIN A1C
Hgb A1c MFr Bld: 5.9 % — ABNORMAL HIGH (ref 4.8–5.6)
Mean Plasma Glucose: 122.63 mg/dL

## 2022-05-21 NOTE — Progress Notes (Addendum)
Anesthesia Review:  PCP:  Wilfred Curtis- LOV 03/11/22  Cardiologist : DR Sherrye Payor andrews,NP LOV 08/16/21  Pulmonology- LOV 05/02/22 - Vira Browns  Chest x-ray : EKG :  03/03/22- have requested from [facility] by fax.  On chart  dated 03/03/22  Echo :have requested by fax - 07/10/20 on chart  Stress test: have requested by fax  09/23/19- strewss echo on chart  Cardiac Cath :  Activity level: can do a flight of stairs wtihout difficutly  Sleep Study/ CPAP : had sleep apnea had surgery  Fasting Blood Sugar :      / Checks Blood Sugar -- times a day:   Blood Thinner/ Instructions /Last Dose: ASA / Instructions/ Last Dose :  DM- type 2 - does not check glucose at home  Hgba1c-05/21/22-  5.9

## 2022-05-26 NOTE — Progress Notes (Signed)
Anesthesia Chart Review   Case: [id number] Date/Time: 05/28/22 0715   Procedure: CYSTOSCOPY/ RETROGRADE/URETEROSCOPY/HOLMIUM LASER/STENT PLACEMENT (Bilateral)   Anesthesia type: General   Pre-op diagnosis: BILATERAL STONES   Location: [facility] PROCEDURE ROOM / [facility]   Surgeons: Rene Paci, MD       DISCUSSION:61 y.o. never smoker with h/o HTN, CAD PCI/DES 2019, DM II, sleep apnea, CAD, bilateral stones scheduled for above procedure 05/28/2022 with Dr. Cristal Deer Liliane Shi.   Per cardio note 02/17/2022 no further cardiac testing needed prior to urology procedure.  Last seen by cardiology 08/16/2021, stable at this visit with 1 year follow up recommended.   Anticipate pt can proceed with planned procedure barring acute status change.   VS: BP (!) 148/87   Pulse 68   Temp 37.1 C (Oral)   Resp 16   Ht 5\' 9"  (1.753 m)   Wt 100.7 kg   SpO2 100%   BMI 32.78 kg/m   PROVIDERS: , MD is PCP   Wilfred Curtis, MD is Cardiologist with [facility]  LABS: Labs reviewed: Acceptable for surgery. (all labs ordered are listed, but only abnormal results are displayed)  Labs Reviewed  HEMOGLOBIN A1C - Abnormal; Notable for the following components:      Result Value   Hgb A1c MFr Bld 5.9 (*)    All other components within normal limits  CBC - Abnormal; Notable for the following components:   WBC 10.8 (*)    Hemoglobin 12.0 (*)    MCH 23.4 (*)    MCHC 28.0 (*)    RDW 21.2 (*)    All other components within normal limits  BASIC METABOLIC PANEL - Abnormal; Notable for the following components:   Glucose, Bld 105 (*)    All other components within normal limits  GLUCOSE, CAPILLARY     IMAGES:   EKG:   CV: Echo 08/08/2020 SUMMARY  The left ventricular size is normal.  Mild left ventricular hypertrophy  LV ejection fraction = 60-65%.  Left ventricular systolic function is normal.  Left ventricular filling pattern is prolonged relaxation.  The right ventricle  is normal in size and function.  There is mild mitral regurgitation.  No significant stenosis seen  There was insufficient TR detected to calculate RV systolic pressure.  The inferior vena cava was not visualized during the exam.  There is no pericardial effusion.  Past Medical History:  Diagnosis Date   Anemia    Arthritis    Asthma 1994   allergy induced asthma   Coronary artery disease    Diabetes mellitus without complication (HCC) type 2    GERD (gastroesophageal reflux disease)    GI bleed 12/01/2021   admit to [facility]   GI bleed 12/01/2021   admit to [facility]   Gout    no recent flares   History of kidney stones    Hypertension    treated with hydrocholothiazide   Myocardial infarction [facility]) 2019   Pneumonia    Sleep apnea    treated with surgery. No problems now.    Past Surgical History:  Procedure Laterality Date   ARTHROSCOPY KNEE W/ DRILLING  2004   right knee   cervical neck fusion     C 3 to C 5 has plates and screws   CORONARY ANGIOPLASTY  2019   stent x 1   CYSTOSCOPY/URETEROSCOPY/HOLMIUM LASER/STENT PLACEMENT Left 09/10/2020   Procedure: CYSTOSCOPY LEFT URETEROSCOPY/HOLMIUM LASER/STENT PLACEMENT;  Surgeon: 09/12/2020, MD;  Location: [facility];  Service: Urology;  Laterality: Left;   EXTRACORPOREAL SHOCK WAVE LITHOTRIPSY Left 09/03/2020   Procedure: LEFT EXTRACORPOREAL SHOCK WAVE LITHOTRIPSY (ESWL);  Surgeon: Crista Elliot, MD;  Location: [facility];  Service: Urology;  Laterality: Left;   LITHOTRIPSY  2001   lower back  2020   fusion L 4 to L 6   NASAL SEPTUM SURGERY  2005   palate remove for osa    MEDICATIONS:  albuterol (PROVENTIL HFA;VENTOLIN HFA) 108 (90 BASE) MCG/ACT inhaler   albuterol (PROVENTIL) (2.5 MG/3ML) 0.083% nebulizer solution   allopurinol (ZYLOPRIM) 300 MG tablet   Ascorbic Acid (VITAMIN C PO)   aspirin EC 81 MG tablet   atenolol (TENORMIN) 25 MG tablet   atorvastatin (LIPITOR) 40 MG  tablet   calcium carbonate (TUMS - DOSED IN MG ELEMENTAL CALCIUM) 500 MG chewable tablet   Cholecalciferol (VITAMIN D3 PO)   clotrimazole (MYCELEX) 10 MG troche   diclofenac Sodium (VOLTAREN) 1 % GEL   EPINEPHrine 0.3 mg/0.3 mL IJ SOAJ injection   ferrous sulfate 325 (65 FE) MG EC tablet   gabapentin (NEURONTIN) 300 MG capsule   Ketorolac Tromethamine 15.75 MG/SPRAY SOLN   Melatonin 10 MG TABS   metFORMIN (GLUCOPHAGE-XR) 500 MG 24 hr tablet   nitroGLYCERIN (NITROSTAT) 0.4 MG SL tablet   Olodaterol HCl (STRIVERDI RESPIMAT) 2.5 MCG/ACT AERS   olopatadine (PATANOL) 0.1 % ophthalmic solution   ondansetron (ZOFRAN-ODT) 4 MG disintegrating tablet   pantoprazole (PROTONIX) 40 MG tablet   polyethylene glycol (MIRALAX / GLYCOLAX) 17 g packet   QVAR REDIHALER 80 MCG/ACT inhaler   sildenafil (VIAGRA) 50 MG tablet   tamsulosin (FLOMAX) 0.4 MG CAPS capsule   Tiotropium Bromide Monohydrate (SPIRIVA RESPIMAT) 1.25 MCG/ACT AERS   tiZANidine (ZANAFLEX) 4 MG tablet   traMADol (ULTRAM) 50 MG tablet   venlafaxine (EFFEXOR) 75 MG tablet   zileuton (ZYFLO) 600 MG TABS   No current facility-administered medications for this encounter.    Jodell Cipro Ward, PA-C [facility] [phone]

## 2022-05-27 NOTE — Anesthesia Preprocedure Evaluation (Signed)
Anesthesia Evaluation  Patient identified by MRN, date of birth, ID band Patient awake    Reviewed: Allergy & Precautions, NPO status , Patient's Chart, lab work & pertinent test results  History of Anesthesia Complications Negative for: history of anesthetic complications  Airway Mallampati: II  TM Distance: >3 FB Neck ROM: Full    Dental  (+) Dental Advisory Given   Pulmonary asthma , sleep apnea    Pulmonary exam normal        Cardiovascular hypertension, Pt. on medications + CAD, + Past MI and + Cardiac Stents  Normal cardiovascular exam     Neuro/Psych negative neurological ROS  negative psych ROS   GI/Hepatic Neg liver ROS,GERD  Controlled and Medicated,,  Endo/Other  diabetes, Type 2, Oral Hypoglycemic Agents    Renal/GU negative Renal ROS     Musculoskeletal  (+) Arthritis ,    Abdominal   Peds  Hematology  (+) Blood dyscrasia, anemia   Anesthesia Other Findings   Reproductive/Obstetrics                             Anesthesia Physical Anesthesia Plan  ASA: 3  Anesthesia Plan: General   Post-op Pain Management: Tylenol PO (pre-op)* and Minimal or no pain anticipated   Induction: Intravenous  PONV Risk Score and Plan: 2 and Treatment may vary due to age or medical condition, Ondansetron, Dexamethasone and Midazolam  Airway Management Planned: LMA  Additional Equipment: None  Intra-op Plan:   Post-operative Plan: Extubation in OR  Informed Consent: I have reviewed the patients History and Physical, chart, labs and discussed the procedure including the risks, benefits and alternatives for the proposed anesthesia with the patient or authorized representative who has indicated his/her understanding and acceptance.     Dental advisory given  Plan Discussed with: CRNA and Anesthesiologist  Anesthesia Plan Comments:        Anesthesia Quick Evaluation

## 2022-05-28 ENCOUNTER — Ambulatory Visit (HOSPITAL_COMMUNITY)
Admission: RE | Admit: 2022-05-28 | Discharge: 2022-05-28 | Disposition: A | Discharge status: Home / Self Care | Payer: BC Managed Care – PPO | Source: Ambulatory Visit | Attending: Urology | Admitting: Urology

## 2022-05-28 ENCOUNTER — Ambulatory Visit (HOSPITAL_COMMUNITY): Payer: BC Managed Care – PPO

## 2022-05-28 ENCOUNTER — Encounter (HOSPITAL_COMMUNITY)
Admission: RE | Disposition: A | Discharge status: Home / Self Care | Payer: Self-pay | Source: Ambulatory Visit | Attending: Urology

## 2022-05-28 ENCOUNTER — Ambulatory Visit (HOSPITAL_COMMUNITY): Payer: BC Managed Care – PPO | Admitting: Physician Assistant

## 2022-05-28 ENCOUNTER — Ambulatory Visit (HOSPITAL_COMMUNITY): Payer: BC Managed Care – PPO | Admitting: Anesthesiology

## 2022-05-28 ENCOUNTER — Encounter (HOSPITAL_COMMUNITY): Payer: Self-pay | Admitting: Urology

## 2022-05-28 DIAGNOSIS — E119 Type 2 diabetes mellitus without complications: Secondary | ICD-10-CM | POA: Insufficient documentation

## 2022-05-28 DIAGNOSIS — Z6832 Body mass index (BMI) 32.0-32.9, adult: Secondary | ICD-10-CM | POA: Insufficient documentation

## 2022-05-28 DIAGNOSIS — Z955 Presence of coronary angioplasty implant and graft: Secondary | ICD-10-CM | POA: Insufficient documentation

## 2022-05-28 DIAGNOSIS — Z7902 Long term (current) use of antithrombotics/antiplatelets: Secondary | ICD-10-CM | POA: Insufficient documentation

## 2022-05-28 DIAGNOSIS — I10 Essential (primary) hypertension: Secondary | ICD-10-CM | POA: Diagnosis not present

## 2022-05-28 DIAGNOSIS — D649 Anemia, unspecified: Secondary | ICD-10-CM | POA: Diagnosis not present

## 2022-05-28 DIAGNOSIS — J45909 Unspecified asthma, uncomplicated: Secondary | ICD-10-CM | POA: Insufficient documentation

## 2022-05-28 DIAGNOSIS — E669 Obesity, unspecified: Secondary | ICD-10-CM | POA: Diagnosis not present

## 2022-05-28 DIAGNOSIS — G4730 Sleep apnea, unspecified: Secondary | ICD-10-CM | POA: Insufficient documentation

## 2022-05-28 DIAGNOSIS — Z8711 Personal history of peptic ulcer disease: Secondary | ICD-10-CM | POA: Insufficient documentation

## 2022-05-28 DIAGNOSIS — K219 Gastro-esophageal reflux disease without esophagitis: Secondary | ICD-10-CM | POA: Diagnosis not present

## 2022-05-28 DIAGNOSIS — N200 Calculus of kidney: Secondary | ICD-10-CM | POA: Insufficient documentation

## 2022-05-28 DIAGNOSIS — I2510 Atherosclerotic heart disease of native coronary artery without angina pectoris: Secondary | ICD-10-CM | POA: Diagnosis not present

## 2022-05-28 DIAGNOSIS — I252 Old myocardial infarction: Secondary | ICD-10-CM | POA: Diagnosis not present

## 2022-05-28 DIAGNOSIS — Z7984 Long term (current) use of oral hypoglycemic drugs: Secondary | ICD-10-CM | POA: Diagnosis not present

## 2022-05-28 DIAGNOSIS — Z01818 Encounter for other preprocedural examination: Secondary | ICD-10-CM

## 2022-05-28 DIAGNOSIS — M1990 Unspecified osteoarthritis, unspecified site: Secondary | ICD-10-CM | POA: Diagnosis not present

## 2022-05-28 DIAGNOSIS — Z981 Arthrodesis status: Secondary | ICD-10-CM | POA: Diagnosis not present

## 2022-05-28 HISTORY — PX: CYSTOSCOPY/URETEROSCOPY/HOLMIUM LASER/STENT PLACEMENT: SHX6546

## 2022-05-28 LAB — GLUCOSE, CAPILLARY
Glucose-Capillary: 116 mg/dL — ABNORMAL HIGH (ref 70–99)
Glucose-Capillary: 157 mg/dL — ABNORMAL HIGH (ref 70–99)

## 2022-05-28 SURGERY — CYSTOSCOPY/URETEROSCOPY/HOLMIUM LASER/STENT PLACEMENT
Anesthesia: General | Laterality: Bilateral

## 2022-05-28 MED ORDER — PROPOFOL 500 MG/50ML IV EMUL
INTRAVENOUS | Status: AC
  Filled 2022-05-28: qty 50

## 2022-05-28 MED ORDER — OXYCODONE-ACETAMINOPHEN 5-325 MG PO TABS: 1.0000 | ORAL_TABLET | ORAL | 0 refills | Status: DC | PRN

## 2022-05-28 MED ORDER — PHENYLEPHRINE HCL (PRESSORS) 10 MG/ML IV SOLN
INTRAVENOUS | Status: AC
  Filled 2022-05-28: qty 1

## 2022-05-28 MED ORDER — CEFAZOLIN SODIUM-DEXTROSE 2-4 GM/100ML-% IV SOLN
2.0000 g | Freq: Once | INTRAVENOUS | Status: AC
  Administered 2022-05-28: 2 g via INTRAVENOUS
  Filled 2022-05-28: qty 100

## 2022-05-28 MED ORDER — ONDANSETRON HCL 4 MG/2ML IJ SOLN
INTRAMUSCULAR | Status: AC
  Filled 2022-05-28: qty 2

## 2022-05-28 MED ORDER — LACTATED RINGERS IV SOLN: INTRAVENOUS | Status: DC

## 2022-05-28 MED ORDER — OXYCODONE HCL 5 MG PO TABS
5.0000 mg | ORAL_TABLET | Freq: Once | ORAL | Status: AC | PRN
  Administered 2022-05-28: 5 mg via ORAL

## 2022-05-28 MED ORDER — OXYCODONE HCL 5 MG/5ML PO SOLN: 5.0000 mg | Freq: Once | ORAL | Status: AC | PRN

## 2022-05-28 MED ORDER — MIDAZOLAM HCL 5 MG/5ML IJ SOLN
INTRAMUSCULAR | Status: DC | PRN
  Administered 2022-05-28: 2 mg via INTRAVENOUS

## 2022-05-28 MED ORDER — LIDOCAINE 2% (20 MG/ML) 5 ML SYRINGE
INTRAMUSCULAR | Status: DC | PRN
  Administered 2022-05-28: 100 mg via INTRAVENOUS

## 2022-05-28 MED ORDER — PHENAZOPYRIDINE HCL 200 MG PO TABS: 200.0000 mg | ORAL_TABLET | Freq: Three times a day (TID) | ORAL | 0 refills | Status: AC | PRN

## 2022-05-28 MED ORDER — ACETAMINOPHEN 500 MG PO TABS
1000.0000 mg | ORAL_TABLET | Freq: Once | ORAL | Status: AC
  Administered 2022-05-28: 1000 mg via ORAL
  Filled 2022-05-28: qty 2

## 2022-05-28 MED ORDER — IOHEXOL 300 MG/ML  SOLN
INTRAMUSCULAR | Status: DC | PRN
  Administered 2022-05-28: 26 mL

## 2022-05-28 MED ORDER — ORAL CARE MOUTH RINSE: 15.0000 mL | Freq: Once | OROMUCOSAL | Status: AC

## 2022-05-28 MED ORDER — PROPOFOL 10 MG/ML IV BOLUS
INTRAVENOUS | Status: DC | PRN
  Administered 2022-05-28: 20 mg via INTRAVENOUS
  Administered 2022-05-28: 180 mg via INTRAVENOUS

## 2022-05-28 MED ORDER — CHLORHEXIDINE GLUCONATE 0.12 % MT SOLN
15.0000 mL | Freq: Once | OROMUCOSAL | Status: AC
  Administered 2022-05-28: 15 mL via OROMUCOSAL

## 2022-05-28 MED ORDER — DEXAMETHASONE SODIUM PHOSPHATE 10 MG/ML IJ SOLN
INTRAMUSCULAR | Status: DC | PRN
  Administered 2022-05-28: 4 mg via INTRAVENOUS

## 2022-05-28 MED ORDER — OXYBUTYNIN CHLORIDE 5 MG PO TABS: 5.0000 mg | ORAL_TABLET | Freq: Three times a day (TID) | ORAL | 1 refills | Status: DC | PRN

## 2022-05-28 MED ORDER — PHENYLEPHRINE 80 MCG/ML (10ML) SYRINGE FOR IV PUSH (FOR BLOOD PRESSURE SUPPORT)
PREFILLED_SYRINGE | INTRAVENOUS | Status: AC
  Filled 2022-05-28: qty 10

## 2022-05-28 MED ORDER — OXYCODONE HCL 5 MG PO TABS
ORAL_TABLET | ORAL | Status: AC
  Filled 2022-05-28: qty 1

## 2022-05-28 MED ORDER — PHENYLEPHRINE 80 MCG/ML (10ML) SYRINGE FOR IV PUSH (FOR BLOOD PRESSURE SUPPORT)
PREFILLED_SYRINGE | INTRAVENOUS | Status: DC | PRN
  Administered 2022-05-28: 160 ug via INTRAVENOUS
  Administered 2022-05-28: 80 ug via INTRAVENOUS
  Administered 2022-05-28 (×4): 160 ug via INTRAVENOUS

## 2022-05-28 MED ORDER — FENTANYL CITRATE PF 50 MCG/ML IJ SOSY
PREFILLED_SYRINGE | INTRAMUSCULAR | Status: AC
  Filled 2022-05-28: qty 1

## 2022-05-28 MED ORDER — CEPHALEXIN 500 MG PO CAPS: 500.0000 mg | ORAL_CAPSULE | Freq: Two times a day (BID) | ORAL | 0 refills | Status: AC

## 2022-05-28 MED ORDER — FENTANYL CITRATE (PF) 100 MCG/2ML IJ SOLN
INTRAMUSCULAR | Status: AC
  Filled 2022-05-28: qty 2

## 2022-05-28 MED ORDER — FENTANYL CITRATE PF 50 MCG/ML IJ SOSY
25.0000 ug | PREFILLED_SYRINGE | INTRAMUSCULAR | Status: DC | PRN
  Administered 2022-05-28: 50 ug via INTRAVENOUS

## 2022-05-28 MED ORDER — MIDAZOLAM HCL 2 MG/2ML IJ SOLN
INTRAMUSCULAR | Status: AC
  Filled 2022-05-28: qty 2

## 2022-05-28 MED ORDER — EPHEDRINE SULFATE-NACL 50-0.9 MG/10ML-% IV SOSY
PREFILLED_SYRINGE | INTRAVENOUS | Status: DC | PRN
  Administered 2022-05-28 (×4): 10 mg via INTRAVENOUS

## 2022-05-28 MED ORDER — LIDOCAINE HCL (PF) 2 % IJ SOLN
INTRAMUSCULAR | Status: AC
  Filled 2022-05-28: qty 5

## 2022-05-28 MED ORDER — PHENYLEPHRINE HCL-NACL 20-0.9 MG/250ML-% IV SOLN
INTRAVENOUS | Status: DC | PRN
  Administered 2022-05-28: 60 ug/min via INTRAVENOUS

## 2022-05-28 MED ORDER — ONDANSETRON HCL 4 MG/2ML IJ SOLN
INTRAMUSCULAR | Status: DC | PRN
  Administered 2022-05-28: 4 mg via INTRAVENOUS

## 2022-05-28 MED ORDER — EPHEDRINE 5 MG/ML INJ
INTRAVENOUS | Status: AC
  Filled 2022-05-28: qty 5

## 2022-05-28 MED ORDER — DEXAMETHASONE SODIUM PHOSPHATE 10 MG/ML IJ SOLN
INTRAMUSCULAR | Status: AC
  Filled 2022-05-28: qty 1

## 2022-05-28 MED ORDER — ONDANSETRON HCL 4 MG PO TABS: 4.0000 mg | ORAL_TABLET | Freq: Every day | ORAL | 1 refills | Status: AC | PRN

## 2022-05-28 MED ORDER — PROMETHAZINE HCL 25 MG/ML IJ SOLN: 6.2500 mg | INTRAMUSCULAR | Status: DC | PRN

## 2022-05-28 MED ORDER — FENTANYL CITRATE (PF) 100 MCG/2ML IJ SOLN
INTRAMUSCULAR | Status: DC | PRN
  Administered 2022-05-28 (×4): 25 ug via INTRAVENOUS

## 2022-05-28 MED ORDER — SODIUM CHLORIDE 0.9 % IR SOLN
Status: DC | PRN
  Administered 2022-05-28: 6000 mL

## 2022-05-28 SURGICAL SUPPLY — 22 items
BAG URO CATCHER STRL LF (MISCELLANEOUS) ×1 IMPLANT
BASKET ZERO TIP NITINOL 2.4FR (BASKET) IMPLANT
CATH URETERAL DUAL LUMEN 10F (MISCELLANEOUS) IMPLANT
CATH URETL OPEN 5X70 (CATHETERS) ×1 IMPLANT
CLOTH BEACON ORANGE TIMEOUT ST (SAFETY) ×1 IMPLANT
EXTRACTOR STONE NITINOL NGAGE (UROLOGICAL SUPPLIES) IMPLANT
GLOVE SURG LX STRL 7.5 STRW (GLOVE) ×1 IMPLANT
GOWN SRG XL LVL 4 BRTHBL STRL (GOWNS) ×1 IMPLANT
GOWN STRL NON-REIN XL LVL4 (GOWNS) ×1
GUIDEWIRE STR DUAL SENSOR (WIRE) IMPLANT
GUIDEWIRE ZIPWRE .038 STRAIGHT (WIRE) ×1 IMPLANT
KIT TURNOVER KIT A (KITS) ×1 IMPLANT
LASER FIB FLEXIVA PULSE ID 365 (Laser) IMPLANT
MANIFOLD NEPTUNE II (INSTRUMENTS) ×1 IMPLANT
PACK CYSTO (CUSTOM PROCEDURE TRAY) ×1 IMPLANT
SHEATH NAVIGATOR HD 11/13X36 (SHEATH) IMPLANT
STENT URET 6FRX24 CONTOUR (STENTS) IMPLANT
STENT URET 6FRX26 CONTOUR (STENTS) IMPLANT
TRACTIP FLEXIVA PULS ID 200XHI (Laser) IMPLANT
TRACTIP FLEXIVA PULSE ID 200 (Laser) ×2
TUBING CONNECTING 10 (TUBING) ×1 IMPLANT
TUBING UROLOGY SET (TUBING) ×1 IMPLANT

## 2022-05-28 NOTE — Op Note (Signed)
Operative Note  Preoperative diagnosis:  1.  Numerous bilateral renal stones  Postoperative diagnosis: 1.  Numerous bilateral renal stones  Procedure(s): 1.  Cystoscopy with bilateral ureteroscopy, bilateral holmium laser lithotripsy and bilateral ureteral stent placement 2.  Bilateral retrograde pyelograms with intraoperative interpretation fluoroscopic imaging  Surgeon: Ellison Hughs, MD  Assistants:  None  Anesthesia:  General  Complications:  None  EBL: 10 mL  Specimens: 1.  Approximately 50, 3 to 5 mm stone/stone fragments were extracted from both kidneys  Drains/Catheters: 1.  Bilateral 6 French, 24 cm JJ stents without tethers  Intraoperative findings:   Numerous bilateral renal stones measuring 3 to 5 cm in greatest dimension Solitary right collecting system with no filling defects or dilation involving the right ureter or right renal pelvis seen on retrograde pyelogram Solitary left collecting system with no filling defects or dilation involving the left ureter or left renal pelvis seen on retrograde pyelogram Left midpole calyx with an acute angle and infundibular stenosis that had a 5 mm stone within it that could not be fractured with the laser or extracted with a basket No ureteral stone burden was seen, bilaterally  Indication:  Hunter Krause is a 61 y.o. male with bilateral nonobstructing renal stones.  He has been consented for the above procedures, voices understanding and wishes to proceed.  Description of procedure:  After informed consent was obtained, the patient was brought to the operating room and general LMA anesthesia was administered. The patient was then placed in the dorsolithotomy position and prepped and draped in the usual sterile fashion. A timeout was performed. A 23 French rigid cystoscope was then inserted into the urethral meatus and advanced into the bladder under direct vision. A complete bladder survey revealed no intravesical  pathology.  A 5 French ureteral catheter was then inserted into the right ureteral orifice and a retrograde pyelogram was obtained, with the findings listed above.  A Glidewire was then used to intubate the lumen of the ureteral catheter and was advanced up to the right renal pelvis, under fluoroscopic guidance.  The catheter was then removed, leaving the wire in place.  An additional sensor wire was then advanced up to the right renal pelvis, or fluoroscopic guidance.  A 13 French, 35 cm ureteral access sheath was then advanced over the sensor wire and into good position within the proximal aspects of the right ureter.  A flexible ureteroscope was then advanced through the lumen of the access sheath and up to the right renal pelvis were numerous stones were identified.  The 200 m holmium laser was then used to fracture the majority of the stones into a host of smaller pieces.  A 0 tip basket was then used to extract all stone fragments greater than 3 mm.  The flexible ureteroscope and ureteral access sheath were then removed under direct vision, identifying no stone burden within the lumen of the right ureter or evidence of ureteral trauma.  A 6 French, 24 cm JJ stent was then placed over the Glidewire and into good position within the right collecting system, confirming placement via fluoroscopy.  A 5 French ureteral catheter was then inserted into the left ureteral orifice and a retrograde pyelogram was obtained, with the findings listed above.  A Glidewire was then used to intubate the lumen of the ureteral catheter and was advanced up to the left renal pelvis, under fluoroscopic guidance.  The catheter was then removed, leaving the wire in place.  A sensor wire was  then advanced up to the left renal pelvis, or fluoroscopic guidance.  A 13 French, 35 cm ureteral access sheath was then advanced over the sensor wire and into good position within the proximal aspects of the left ureter, confirming  placement via fluoroscopy.  A flexible ureteroscope was then advanced through the lumen of the ureteral access sheath and up to the left renal pelvis where multiple stones were identified.  The 200 m holmium laser was then used to fracture the stones into numerous smaller pieces.  The engage basket was then used to remove all sizable stone fragments from the left renal pelvis.  A left midpole calyx was encountered with the findings listed above.  The flexible ureteroscope and ureteral access sheath were then removed under direct vision with no sizable stone burden within the lumen of the ureter or evidence of ureteral trauma.  A 6 French, 24 cm JJ stent was then advanced over the wire and into good position within the left collecting system, confirming placement via fluoroscopy.  The patient's bladder was drained.  He tolerated the procedure well and was transferred to the postanesthesia in stable condition.   Plan: Discharge home.  Plan for postop KUB and possible bilateral ureteral stent removal in 1 week

## 2022-05-28 NOTE — OR Nursing (Signed)
Stone taken by Dr. Winter 

## 2022-05-28 NOTE — Anesthesia Postprocedure Evaluation (Signed)
Anesthesia Post Note  Patient: Hunter Krause  Procedure(s) Performed: CYSTOSCOPY/ RETROGRADE/URETEROSCOPY/HOLMIUM LASER/STENT PLACEMENT (Bilateral)     Patient location during evaluation: PACU Anesthesia Type: General Level of consciousness: awake and alert Pain management: pain level controlled Vital Signs Assessment: post-procedure vital signs reviewed and stable Respiratory status: spontaneous breathing, nonlabored ventilation and respiratory function stable Cardiovascular status: stable and blood pressure returned to baseline Anesthetic complications: no   No notable events documented.  Last Vitals:  Vitals:   05/28/22 1100 05/28/22 1116  BP: (!) 165/94 (!) 148/86  Pulse: 87 94  Resp: 13 18  Temp:  36.7 C  SpO2: 98% 97%    Last Pain:  Vitals:   05/28/22 1116  TempSrc: Oral  PainSc: 3                  Beryle Lathe

## 2022-05-28 NOTE — H&P (Signed)
PRE-OP H&P  Office Visit Report     05/13/2022   --------------------------------------------------------------------------------   Hunter Krause  MRN: [id number]  DOB: Feb 12, 1961, 61 year old Male  PRIMARY CARE:  Gabriel Earing, MD  REFERRING:  Doyne Keel, MD  PROVIDER:  Rhoderick Moody, M.D.  TREATING:  Anne Fu, NP  LOCATION:  [facility] [phone]     --------------------------------------------------------------------------------   CC/HPI: HPI: Hunter Krause is a 61 year old male with a history of kidney stones. PMH significant for gastric ulcer, L spine fusion, Obesity, CAD/MI/Stent/Plavix (follows [facility]).   03/01/2019: Presents today with complaints of pain related to KS. KUB at last OV showed bilateral non-obstructing KS. No evidence of hydronephrosis on the right side via u/s. He was started on HCTZ for stone prophylaxis. Symptoms began about 4-5 days ago with right lower back pain, now predominantly in the right lower quadrant described as constant and moderately severe. He continues tamsulosin. Associated with increased hesitancy and straining to void. He has not seen any stone material passed. Denies dysuria or gross hematuria. Denies fevers or chills. He has had some nausea but not vomiting.   03/15/2019: Returns today for follow-up. KUB somewhat inconclusive at last office visit for the presence of a definitive right ureteral calculi. Urine culture did result positive for 10,000 colonies Escherichia coli. He began treatment with doxycycline. The patient completed the entire course of the antibiotic and noted resolution of back and lower quadrant abdominal pain on the right side. No longer having nausea and no longer having any hesitancy or straining to void. He has not seen any interval stone material passage. Continues to not have any burning or painful urination, gross hematuria. Patient remains afebrile.   11.13.2020: He presents today  feeling what he has is another KS on the right side. He has been followed rather aggressively for these and has had multiple 24 hr urines (he is about to complete another). He denies any fever or nausea but he has had blood per urine. He has been managing his pain with tramadol and Sprix (last taken at 11 am today). He denies any urinary changes.   06/28/19: The patient is here today for a routine follow-up. The patient was seen by Hunter Krause in November for worsening right flank pain. KUB at that time showed a possible 4 mm right distal ureteral calculus. He presents today reporting that he passed a small stone several weeks ago and denies residual flank pain, nausea, vomiting, dysuria or hematuria. UA is clear today. From a urinary standpoint, he continues to take tamsulosin once daily. He reports a good FOS and feels like he is emptying his bladder well. He has occasional urgency/frequency, but is not bothered by it. Nocturia x 1. Denies interval UTIs, dysuria or hematuria.   24 hour urine (06/07/2019)  -urine volume -3.24  -all the parameters were in the "decreased risk" category   05/23/2020: Here today for evaluation regarding possible kidney stone event. Symptoms began last night with sharp right lower back pain radiating into the right flank. Pain was severe with some associated nausea and increased urinary frequency/urgency. Not associated with burning or painful urination, visible blood in the urine. He continues tamsulosin daily. Patient had a previous prescription for Sprix that he used which helped reduce the severity of his symptoms until he could follow up this morning. Still having moderate discomfort mostly in the right flank. Symptoms not associated with fevers or chills.   08/27/2020: I have suspicion  for an obstructing right proximal ureteral calculi seen on inverse imaging along the anatomical expected tract of the right proximal ureter at time of last OV. I plan for follow-up in 2  weeks from that visit but the appointment never occurred for unclear reasons. He tells me after that he passed a stone with symptom relief. Since then he has passed 3 additional stones with only mild to moderate but fleeting pain and discomfort. Now today presenting for increased/worsening pain on the left side similar to previous exacerbations of stone events. Symptoms present now for approximately 3 days. Pain only moderately controlled with previously prescribed tramadol as well as ketorolac. He does have an associated mild increase in bothersome urgency as well as some increased hesitancy and starting/stopping of his stream. He had hematuria at the onset of symptoms but has not seen any today. Denies burning or painful urination. No associated fevers or chills, nausea/vomiting.   09/17/2020: Patient was originally scheduled to undergo shockwave lithotripsy approximately 2 weeks ago but at time of procedure patient was noted to be taking Plavix and due to the ureteral stone burdens proximity to the renal shadow, shockwave lithotripsy could not be performed. He was then scheduled to undergo ureteroscopy approximately 1 week later on 03/14. A ureteral stent was left at the conclusion of the procedure. He returns today for follow-up exam.   He tolerated the stent well with only a mild increase in bothersome frequency/urgency with correlated mild dysuria and intermittent passage of some hematuria. He has not had significant recurrence of left-sided pain or discomfort. He remains afebrile and tolerating a diet without nausea or vomiting.  CC: KS   11/16/20: The patient is here today for a routine follow-up. The patient reports intermittent episodes of sharp right-sided flank pain that radiates into the right lower quadrant and is associated with gross hematuria. His hematuria has since improved, but his pain persists. He denies nausea/vomiting or fever/chills. Sprix and tramadol help alleviate his discomfort,  which she rates as a 7 out of 10.   09/27/2021: The patient presents today with intermittent episodes of sharp right-sided flank pain associated with nausea. He was seen on 09/10/2021 and had a CT scan at that time, which revealed bilateral nonobstructing renal stones with multiple 7 to 8 mm stones in each kidney. He states that this episode of renal colic is worse than his prior episodes, which have been numerous. He denies fever/chills, hematuria or emesis. Currently taking Sprix and tramadol with only marginal improvement in his pain. He was prescribed Percocet at his last visit and states that it helped the most with alleviating his discomfort.   10/24/21: The patient is here today for a routine follow-up. Recent CTSS revealed numerous bilateral renal stones. His flank pain has since resolved. He denies interval episodes of dysuria, hematuria or flank pain. Review of his CT scan shows multiple bilateral renal stones. He would like to have them addressed later this year via ureteroscopy.   01/24/22: 61 year old male with a history of nephrolithiasis, patient of Dr. Sande Brothers, who presents today for evaluation of right flank pain. The pain began on Tuesday. He is complaining of sharp pain. It is in the right flank. It does not radiate down around to his testicles. He denies any real associated nausea or vomiting. He denies any dysuria or hematuria. He has been afebrile. The patient manages his pain with Sprix and muscle relaxers. This seems to control his pain reasonably well   The patient had a CT scan  in our office in March which demonstrated bilateral nonobstructing stones. He subsequently saw Dr. Liliane Shi, and there were plans to perform ureteroscopy for his nonobstructing stones. The patient also was seen at [facility] a month ago for abdominal pain and had a CT scan. Ultimately was diagnosed with a stomach ulcer.   The patient underwent ureteroscopy and March 2022 with Dr. Alvester Morin.   03/10/22: The patient  is here today for a routine follow-up. He was recently hospitalized due to a bleeding gastric ulcer and his hemoglobin dropped to as low as 6.5, per the patient. He is recovering well after blood transfusion and starting supplemental iron. He states that while he was hospitalized for his gastric ulcer, he passed a small stone. He denies any residual flank pain, dysuria or hematuria. UA today is clear. He is interested in proceeding with surgery at the end of November in hopes of allowing his red blood cell count to improve following his most recent episode.    05/01/2022: Patient added on for right flank pain. He reports over last few weeks he's had left and right flank pain and passed eight stones. Passed an 11 mm on the left. He takes tamsulosin. His pain is manageable, but wants something other than tramadol. No fever or emesis. UA clear.   He has a history of kidney stones. CT scan March 2023 revealed bilateral nonobstructing stones. CT scan repeated at [facility] June 2023 and he had a stomach ulcer. KUB July 2023 with bilateral stones. He was admitted with a stomach ulcer and hemoglobin did drop to 6.5. Dr. Liliane Shi is planning on a bilateral ureteroscopy soon as they were waiting for his hemoglobin to recover. March 05, 2022 hemoglobin was 8. Creatinine 1.08.   The patient underwent ureteroscopy and March 2022 with Dr. Alvester Morin.   He take an 81 mg ASA.   05/13/2022: Preoperative appointment today before undergoing bilateral ureteroscopy with Dr. Liliane Shi on 11/29. Dr. Mena Goes gave him a prescription for Vicodin to take in place of tramadol at last appointment. Currently asymptomatic. Denying any unilateral lower back or flank pain/discomfort suggestive of obstructive uropathy. Denies any interval stone material passage. He has had no dysuria, gross hematuria and otherwise voiding at his baseline. Denies any other changes in past medical history, prescription medications taken on a daily basis. No  recent surgical or procedural intervention. He had recent labs done by his pulmonologist, I was unable to pull them up in epic/care everywhere but he showed me the results on his phone. Hemoglobin reassuringly has improved, now 10.6. Denies any recent chest pain, shortness of breath, lightheadedness or dizziness. No recent fevers or chills, nausea/vomiting. He is having normal bowel movements.     ALLERGIES: flu shot    MEDICATIONS: Allopurinol 300 mg tablet 1 tablet PO Daily  Lipitor  Metformin Hcl  Tamsulosin Hcl 0.4 mg capsule TAKE 1 CAPSULE AT BEDTIME  Tramadol Hcl 50 mg tablet 1-2 tablet PO Q 6 H PRN  Albuterol Sulfate 2.5 mg/3 ml (0.083 %) vial, nebulizer 0 Inhalation  Aspir 81  Azelastine HCl - 0.1 % Nasal Solution Nasal  CLOMIPRAMINE HCL Daily  CYCLOBENZAPRINE HCL PRN  Fluticasone Propionate 50 mcg/actuation spray, suspension Nasal  GABAPENTIN Daily  Hydrocodone-Acetaminophen 5 mg-325 mg tablet 1 tablet PO Q 6 H PRN  LISINOPRIL Daily  Ondansetron Odt 8 mg tablet,disintegrating 1 tab Q8 PRN nausea  Protonix  Qvar 80 mcg aerosol with adapter Inhalation  Striverdi Respimat 2.5 mcg/actuation mist inhaler Inhalation  TENORMIN Daily  Tizanidine Hcl  Venlafaxine Hcl 75 mg tablet 1 tablet PO Daily  VIAGRA Daily  Zetia 10 mg tablet Oral  Zyflo 600 mg tablet Oral     GU PSH: Cysto Remove Stent FB Sim - 10/04/20 ESWL - Oct 04, 2013, 2012/10/04 Ureteroscopic laser litho, Left - 2020-10-04       PSH Notes: Cystoscopy With Fragmentation Of Ureteral Calculus, Lithotripsy, Lithotripsy, Knee Surgery   NON-GU PSH: Cardiac Stent Placement - 04-Oct-2017 Cystoscopy; Stone Remove - 2014/10/05     GU PMH: Flank Pain - 05/01/2022, - 01/24/2022 (Stable), - 09/27/2021 (Stable), - 11/16/2020, 04-Oct-2020, - 05/23/2020, 2018/10/05 Renal calculus, we discussed a KUB but it won't change the plan. I will notify Dr. Liliane Shi of pt visit, Hgb of 8 and clear UA. - 05/01/2022, - 03/10/2022, - 01/24/2022, - 10/24/2021, - 09/27/2021, - 11/16/2020, 10-04-20,  Oct 04, 2020, Oct 05, 2018, 10/04/2016, Bilateral kidney stones, - 05-Oct-2015 Renal and ureteral calculus (Stable) - 09/10/2021, (Stable), Right, He feels as though he is symptomatic of a right sided KS. CT today shows possible stone in right distal ureter (3-4 mm in size)., Oct 05, 2018, October 05, 2018 History of urolithiasis - 11/16/2020, - 05/23/2020, Nephrolithiasis, - 10/04/12 Ureteral calculus - 2020/10/04, (Stable), - 10-04-20 (Stable), - 05/23/2020, - 2020, Right, 3 mm right UPJ w/o hydro, - Oct 05, 2018 Encounter for Prostate Cancer screening (Stable) - 05-Oct-2018, Prostate cancer screening, - 2012/10/04 Urinary Urgency - 10/05/18 Acute Cystitis/UTI (Improving) - 10/05/2018 Microscopic hematuria - 05-Oct-2018 Renal colic - 10/04/17 BPH w/LUTS - 10/04/16 Family Hx of Prostate Cancer - 10-04-16 Urinary Frequency - October 04, 2016 RLQ pain, Abdominal pain, RLQ (right lower quadrant) - Oct 05, 2014 Abdominal Pain Unspec, Right flank pain - 10/05/2014, Acute left flank pain, - 10-05-2014 LLQ pain, Abdominal pain, LLQ (left lower quadrant) - 05-Oct-2014 RUQ pain, Abdominal pain, RUQ (right upper quadrant) - October 05, 2014 Dysuria, Dysuria - 10-04-2013 Gross hematuria, Gross hematuria - 2013-10-04 Other microscopic hematuria, Microscopic hematuria - 2013/10/04 Low back pain, Lower back pain - 2014 Lower abdominal pain, unspecified, Lower abdominal pain - 10-04-2012 Urinary Retention, Unspec, Incomplete bladder emptying - 2014    NON-GU PMH: Encounter for general adult medical examination without abnormal findings, Encounter for preventive health examination - Oct 05, 2015 Diverticulosis, Diverticulosis - 2012-10-04 Diverticulosis of large intestine without perforation or abscess without bleeding, Diverticulosis of colon - 2012/10/04 Irritable bowel syndrome with diarrhea, Irritable Bowel Syndrome - 04-Oct-2012 Personal history of other diseases of the digestive system, History of esophageal reflux - 2012-10-04    FAMILY HISTORY: Death In The Family Father - Runs In Family Death In The Family Mother - Runs In Family Diabetes - Mother Family Health Status Number - Mother Heart  Disease - Runs In Family, Father Ischemic Stroke - Mother nephrolithiasis - Father, Runs In Family Prostate Cancer - Runs In Family Stroke Syndrome - Mother   SOCIAL HISTORY: Marital Status: Married Preferred Language: English; Ethnicity: Not Hispanic Or Latino; Race: White Current Smoking Status: Patient has never smoked.  Social Drinker.  Drinks 1 caffeinated drink per day. Patient's occupation Product/process development scientist.     Notes: Never A Smoker, Tobacco Use, Caffeine Use, Alcohol Use, Marital History - Currently Married   REVIEW OF SYSTEMS:    GU Review Male:   Patient denies frequent urination, hard to postpone urination, burning/ pain with urination, get up at night to urinate, leakage of urine, stream starts and stops, trouble starting your stream, have to strain to urinate , erection problems, and penile pain.  Gastrointestinal (Upper):   Patient denies  nausea, vomiting, and indigestion/ heartburn.  Gastrointestinal (Lower):   Patient denies diarrhea and constipation.  Constitutional:   Patient denies fever, night sweats, weight loss, and fatigue.  Skin:   Patient denies itching and skin rash/ lesion.  Eyes:   Patient denies blurred vision and double vision.  Ears/ Nose/ Throat:   Patient denies sore throat and sinus problems.  Hematologic/Lymphatic:   Patient denies swollen glands and easy bruising.  Cardiovascular:   Patient denies leg swelling and chest pains.  Respiratory:   Patient denies cough and shortness of breath.  Endocrine:   Patient denies excessive thirst.  Musculoskeletal:   Patient reports back pain. Patient denies joint pain.  Neurological:   Patient denies headaches and dizziness.  Psychologic:   Patient denies depression and anxiety.   Notes: side pain Reviewed previous review of systems 05/01/2022. No changes.   VITAL SIGNS:      05/13/2022 08:33 AM  BP 124/72 mmHg  Pulse 74 /min  Temperature 97.7 F / 36.5 C   MULTI-SYSTEM PHYSICAL EXAMINATION:     Constitutional: Well-nourished. No physical deformities. Normally developed. Good grooming.  Neck: Neck symmetrical, not swollen. Normal tracheal position.  Respiratory: No labored breathing, no use of accessory muscles.   Cardiovascular: Normal temperature, normal extremity pulses, no swelling, no varicosities.  Skin: No paleness, no jaundice, no cyanosis. No lesion, no ulcer, no rash.  Neurologic / Psychiatric: Oriented to time, oriented to place, oriented to person. No depression, no anxiety, no agitation.  Gastrointestinal: No mass, no tenderness, no rigidity, non obese abdomen.  Musculoskeletal: Normal gait and station of head and neck.     Complexity of Data:  Source Of History:  Patient, Medical Record Summary  Lab Test Review:   Stone Analysis, CBC with Diff, CMP  Records Review:   Previous Doctor Records, Previous Hospital Records, Previous Patient Records  Urine Test Review:   Urinalysis, Urine Culture  X-Ray Review: C.T. Abdomen/Pelvis: Reviewed Films. Reviewed Report.     06/28/19 07/30/10 03/09/07  PSA  Total PSA 1.40 ng/mL 0.78  0.76     05/13/22  Urinalysis  Urine Appearance Clear   Urine Color Yellow   Urine Glucose Neg mg/dL  Urine Bilirubin Neg mg/dL  Urine Ketones Neg mg/dL  Urine Specific Gravity 1.025   Urine Blood Neg ery/uL  Urine pH 6.0   Urine Protein Neg mg/dL  Urine Urobilinogen 0.2 mg/dL  Urine Nitrites Neg   Urine Leukocyte Esterase Neg leu/uL   PROCEDURES:          Urinalysis Dipstick Dipstick Cont'd  Color: Yellow Bilirubin: Neg mg/dL  Appearance: Clear Ketones: Neg mg/dL  Specific Gravity: 0.569 Blood: Neg ery/uL  pH: 6.0 Protein: Neg mg/dL  Glucose: Neg mg/dL Urobilinogen: 0.2 mg/dL    Nitrites: Neg    Leukocyte Esterase: Neg leu/uL    ASSESSMENT:      ICD-10 Details  1 GU:   Renal calculus - N20.0 Chronic, Worsening   PLAN:           Orders Labs Urine Culture          Schedule Return Visit/Planned Activity: Keep Scheduled  Appointment - Follow up MD, Schedule Surgery          Document Letter(s):  Created for Patient: Clinical Summary         Notes:   Currently asymptomatic. All questions answered to the best of my ability regarding the upcoming procedure and expected postoperative course with understanding expressed by the patient. Urine culture  sent today to serve as a baseline. He will proceed with previously scheduled bilateral ureteroscopy with Dr. Liliane Shi on 11/29.

## 2022-05-28 NOTE — Anesthesia Procedure Notes (Signed)
Procedure Name: LMA Insertion Date/Time: 05/28/2022 7:57 AM  Performed by: Pearson Grippe, CRNAPre-anesthesia Checklist: Patient identified, Emergency Drugs available, Suction available and Patient being monitored Patient Re-evaluated:Patient Re-evaluated prior to induction Oxygen Delivery Method: Circle system utilized Preoxygenation: Pre-oxygenation with 100% oxygen Induction Type: IV induction Ventilation: Mask ventilation without difficulty LMA: LMA inserted LMA Size: 5.0 Number of attempts: 1 Airway Equipment and Method: Bite block Placement Confirmation: positive ETCO2 Tube secured with: Tape Dental Injury: Teeth and Oropharynx as per pre-operative assessment

## 2022-05-28 NOTE — Transfer of Care (Signed)
Immediate Anesthesia Transfer of Care Note  Patient: Hunter Krause  Procedure(s) Performed: CYSTOSCOPY/ RETROGRADE/URETEROSCOPY/HOLMIUM LASER/STENT PLACEMENT (Bilateral)  Patient Location: PACU  Anesthesia Type:General  Level of Consciousness: awake, alert , and oriented  Airway & Oxygen Therapy: Patient Spontanous Breathing and Patient connected to face mask oxygen  Post-op Assessment: Report given to RN and Post -op Vital signs reviewed and stable  Post vital signs: Reviewed and stable  Last Vitals:  Vitals Value Taken Time  BP 142/78   Temp    Pulse 92 05/28/22 1037  Resp 16 05/28/22 1037  SpO2 97 % 05/28/22 1037  Vitals shown include unvalidated device data.  Last Pain:  Vitals:   05/28/22 0610  TempSrc:   PainSc: 0-No pain      Patients Stated Pain Goal: 4 (05/28/22 0610)  Complications: No notable events documented.

## 2022-05-29 ENCOUNTER — Encounter (HOSPITAL_COMMUNITY): Payer: Self-pay | Admitting: Urology

## 2023-07-13 ENCOUNTER — Other Ambulatory Visit: Payer: Self-pay | Admitting: Urology

## 2023-07-13 DIAGNOSIS — D49511 Neoplasm of unspecified behavior of right kidney: Secondary | ICD-10-CM

## 2023-07-21 ENCOUNTER — Emergency Department (HOSPITAL_BASED_OUTPATIENT_CLINIC_OR_DEPARTMENT_OTHER): Payer: BC Managed Care – PPO

## 2023-07-21 ENCOUNTER — Emergency Department (HOSPITAL_BASED_OUTPATIENT_CLINIC_OR_DEPARTMENT_OTHER)
Admission: EM | Admit: 2023-07-21 | Discharge: 2023-07-22 | Disposition: A | Discharge status: Home / Self Care | Payer: BC Managed Care – PPO | Attending: Emergency Medicine | Admitting: Emergency Medicine

## 2023-07-21 ENCOUNTER — Encounter (HOSPITAL_BASED_OUTPATIENT_CLINIC_OR_DEPARTMENT_OTHER): Payer: Self-pay

## 2023-07-21 ENCOUNTER — Other Ambulatory Visit: Payer: Self-pay

## 2023-07-21 DIAGNOSIS — Z20822 Contact with and (suspected) exposure to covid-19: Secondary | ICD-10-CM | POA: Diagnosis not present

## 2023-07-21 DIAGNOSIS — J45909 Unspecified asthma, uncomplicated: Secondary | ICD-10-CM | POA: Diagnosis not present

## 2023-07-21 DIAGNOSIS — I10 Essential (primary) hypertension: Secondary | ICD-10-CM | POA: Insufficient documentation

## 2023-07-21 DIAGNOSIS — R079 Chest pain, unspecified: Secondary | ICD-10-CM | POA: Diagnosis present

## 2023-07-21 DIAGNOSIS — Z5329 Procedure and treatment not carried out because of patient's decision for other reasons: Secondary | ICD-10-CM | POA: Diagnosis not present

## 2023-07-21 DIAGNOSIS — Z7984 Long term (current) use of oral hypoglycemic drugs: Secondary | ICD-10-CM | POA: Diagnosis not present

## 2023-07-21 DIAGNOSIS — E119 Type 2 diabetes mellitus without complications: Secondary | ICD-10-CM | POA: Insufficient documentation

## 2023-07-21 DIAGNOSIS — Z7982 Long term (current) use of aspirin: Secondary | ICD-10-CM | POA: Diagnosis not present

## 2023-07-21 DIAGNOSIS — Z79899 Other long term (current) drug therapy: Secondary | ICD-10-CM | POA: Diagnosis not present

## 2023-07-21 DIAGNOSIS — R0602 Shortness of breath: Secondary | ICD-10-CM | POA: Insufficient documentation

## 2023-07-21 LAB — BRAIN NATRIURETIC PEPTIDE: B Natriuretic Peptide: 40.4 pg/mL (ref 0.0–100.0)

## 2023-07-21 LAB — BASIC METABOLIC PANEL
Anion gap: 11 (ref 5–15)
BUN: 18 mg/dL (ref 8–23)
CO2: 20 mmol/L — ABNORMAL LOW (ref 22–32)
Calcium: 9.2 mg/dL (ref 8.9–10.3)
Chloride: 105 mmol/L (ref 98–111)
Creatinine, Ser: 1.33 mg/dL — ABNORMAL HIGH (ref 0.61–1.24)
GFR, Estimated: >60 mL/min (ref >60)
Glucose, Bld: 167 mg/dL — ABNORMAL HIGH (ref 70–99)
Potassium: 3.6 mmol/L (ref 3.5–5.1)
Sodium: 136 mmol/L (ref 135–145)

## 2023-07-21 LAB — HEPATIC FUNCTION PANEL
ALT: 31 U/L (ref 0–44)
AST: 25 U/L (ref 15–41)
Albumin: 3.9 g/dL (ref 3.5–5.0)
Alkaline Phosphatase: 79 U/L (ref 38–126)
Bilirubin, Direct: 0.1 mg/dL (ref 0.0–0.2)
Indirect Bilirubin: 0.5 mg/dL (ref 0.3–0.9)
Total Bilirubin: 0.6 mg/dL (ref 0.0–1.2)
Total Protein: 6.6 g/dL (ref 6.5–8.1)

## 2023-07-21 LAB — RESP PANEL BY RT-PCR (RSV, FLU A&B, COVID)  RVPGX2
Influenza A by PCR: NEGATIVE
Influenza B by PCR: NEGATIVE
Resp Syncytial Virus by PCR: NEGATIVE
SARS Coronavirus 2 by RT PCR: NEGATIVE

## 2023-07-21 LAB — CBC
HCT: 42.9 % (ref 39.0–52.0)
Hemoglobin: 14.5 g/dL (ref 13.0–17.0)
MCH: 31.5 pg (ref 26.0–34.0)
MCHC: 33.8 g/dL (ref 30.0–36.0)
MCV: 93.3 fL (ref 80.0–100.0)
Platelets: 215 10*3/uL (ref 150–400)
RBC: 4.6 MIL/uL (ref 4.22–5.81)
RDW: 13.2 % (ref 11.5–15.5)
WBC: 10.2 10*3/uL (ref 4.0–10.5)
nRBC: 0 % (ref 0.0–0.2)

## 2023-07-21 LAB — TROPONIN I (HIGH SENSITIVITY)
Troponin I (High Sensitivity): 5 ng/L (ref <18)
Troponin I (High Sensitivity): 5 ng/L (ref <18)

## 2023-07-21 LAB — LIPASE, BLOOD: Lipase: 38 U/L (ref 11–51)

## 2023-07-21 LAB — D-DIMER, QUANTITATIVE: D-Dimer, Quant: 0.35 ug{FEU}/mL (ref 0.00–0.50)

## 2023-07-21 MED ORDER — PANTOPRAZOLE SODIUM 40 MG IV SOLR
40.0000 mg | Freq: Once | INTRAVENOUS | Status: AC
Start: 2023-07-21 — End: 2023-07-21
  Administered 2023-07-21: 40 mg via INTRAVENOUS
  Filled 2023-07-21: qty 10

## 2023-07-21 MED ORDER — TAMSULOSIN HCL 0.4 MG PO CAPS
0.4000 mg | ORAL_CAPSULE | Freq: Once | ORAL | Status: AC
Start: 2023-07-21 — End: 2023-07-21
  Administered 2023-07-21: 0.4 mg via ORAL
  Filled 2023-07-21: qty 1

## 2023-07-21 MED ORDER — BECLOMETHASONE DIPROP HFA 80 MCG/ACT IN AERB
2.0000 | INHALATION_SPRAY | Freq: Two times a day (BID) | RESPIRATORY_TRACT | Status: DC
Start: 2023-07-21 — End: 2023-07-21

## 2023-07-21 MED ORDER — ASPIRIN 325 MG PO TBEC
325.0000 mg | DELAYED_RELEASE_TABLET | Freq: Once | ORAL | Status: AC
  Administered 2023-07-21: 325 mg via ORAL
  Filled 2023-07-21: qty 1

## 2023-07-21 MED ORDER — MELATONIN 3 MG PO TABS
9.0000 mg | ORAL_TABLET | Freq: Every day | ORAL | Status: DC
  Administered 2023-07-21: 9 mg via ORAL
  Filled 2023-07-21: qty 3

## 2023-07-21 MED ORDER — PANTOPRAZOLE SODIUM 40 MG IV SOLR
40.0000 mg | Freq: Once | INTRAVENOUS | Status: AC
  Administered 2023-07-21: 40 mg via INTRAVENOUS
  Filled 2023-07-21: qty 10

## 2023-07-21 MED ORDER — NITROGLYCERIN 0.4 MG SL SUBL
0.4000 mg | SUBLINGUAL_TABLET | SUBLINGUAL | Status: DC | PRN
  Filled 2023-07-21: qty 1

## 2023-07-21 MED ORDER — ATORVASTATIN CALCIUM 40 MG PO TABS
40.0000 mg | ORAL_TABLET | Freq: Every day | ORAL | Status: DC
  Administered 2023-07-21: 40 mg via ORAL
  Filled 2023-07-21: qty 1

## 2023-07-21 MED ORDER — ATENOLOL 25 MG PO TABS
25.0000 mg | ORAL_TABLET | Freq: Once | ORAL | Status: AC
  Administered 2023-07-21: 25 mg via ORAL
  Filled 2023-07-21: qty 1

## 2023-07-21 NOTE — ED Notes (Signed)
Called [facility] for transfer.

## 2023-07-21 NOTE — ED Triage Notes (Signed)
Pt arrives with complaint of right sided chest pain and shortness of breath. Had a heart attack in 2019. Pain started about 30 minutes ago.

## 2023-07-21 NOTE — ED Provider Notes (Cosign Needed)
[place] [facility] AT [facility] Provider Note   CSN: [id number] Arrival date & time: 07/21/23  1316     History  Chief Complaint  Patient presents with   Chest Pain    Hunter Krause is a 63 y.o. male with a history of MI, asthma, hypertension, diabetes mellitus who presents the ED today for chest pain.  Patient reports that after eating lunch she started to develop pain at the left side of his chest.  He states that the pain was sharp and associated with shortness of breath.  At the time of evaluation, patient reports that the pain has improved but still is there.  Patient was seen at [facility] ED 4 days ago for the same complaint.  He was admitted for overnight observation at the time and had a non-diagnostic stress test.  He is suppose to follow-up with his cardiologist in the outpatient setting for a nuclear scan, which he has not had yet.     Home Medications Prior to Admission medications   Medication Sig Start Date End Date Taking? Authorizing Provider  albuterol (PROVENTIL HFA;VENTOLIN HFA) 108 (90 BASE) MCG/ACT inhaler Inhale 2 puffs into the lungs every 6 (six) hours as needed for wheezing or shortness of breath.    [provider]  albuterol (PROVENTIL) (2.5 MG/3ML) 0.083% nebulizer solution Take 2.5 mg by nebulization every 6 (six) hours as needed for wheezing or shortness of breath. 11/29/21 03/04/23  [provider]  allopurinol (ZYLOPRIM) 300 MG tablet Take 300 mg by mouth daily.    [provider]  Ascorbic Acid (VITAMIN C PO) Take 1 tablet by mouth daily.    [provider]  aspirin EC 81 MG tablet Take 81 mg by mouth daily.    [provider]  atenolol (TENORMIN) 25 MG tablet Take 25 mg by mouth daily.    [provider]  atorvastatin (LIPITOR) 40 MG tablet Take 40 mg by mouth every evening.    [provider]  calcium carbonate (TUMS - DOSED IN MG ELEMENTAL CALCIUM) 500 MG chewable  tablet Chew 1 tablet by mouth 3 (three) times daily as needed for indigestion or heartburn.    [provider]  Cholecalciferol (VITAMIN D3 PO) Take 1 tablet by mouth daily.    [provider]  clotrimazole (MYCELEX) 10 MG troche Take 10 mg by mouth 5 (five) times daily as needed (thrush).    [provider]  diclofenac Sodium (VOLTAREN) 1 % GEL Apply 2 g topically daily as needed (pain).    [provider]  EPINEPHrine 0.3 mg/0.3 mL IJ SOAJ injection Inject 0.3 mg into the muscle as needed for anaphylaxis.    [provider]  ferrous sulfate 325 (65 FE) MG EC tablet Take 325 mg by mouth daily.    [provider]  gabapentin (NEURONTIN) 300 MG capsule Take 300 mg by mouth daily as needed (pain).    [provider]  Ketorolac Tromethamine 15.75 MG/SPRAY SOLN Place 1 spray into the nose every 8 (eight) hours as needed (kidney stone pain).    [provider]  Melatonin 10 MG TABS Take 10 mg by mouth at bedtime as needed (sleep).    [provider]  metFORMIN (GLUCOPHAGE-XR) 500 MG 24 hr tablet Take 1,000 mg by mouth daily with breakfast.    [provider]  nitroGLYCERIN (NITROSTAT) 0.4 MG SL tablet Place 0.4 mg under the tongue every 5 (five) minutes as needed for chest  pain.    [provider]  Olodaterol HCl (STRIVERDI RESPIMAT) 2.5 MCG/ACT AERS Inhale 2 each into the lungs daily.    [provider]  olopatadine (PATANOL) 0.1 % ophthalmic solution Place 1 drop into both eyes 2 (two) times daily. 05/02/22   [provider]  ondansetron (ZOFRAN-ODT) 4 MG disintegrating tablet Take 4 mg by mouth every 4 (four) hours as needed for nausea or vomiting.    [provider]  oxybutynin (DITROPAN) 5 MG tablet Take 1 tablet (5 mg total) by mouth every 8 (eight) hours as needed for bladder spasms. 05/28/22   Rene Paci, MD  oxyCODONE-acetaminophen (PERCOCET) 5-325 MG tablet  Take 1 tablet by mouth every 4 (four) hours as needed for severe pain. 05/28/22   Rene Paci, MD  pantoprazole (PROTONIX) 40 MG tablet Take 40 mg by mouth daily.    [provider]  polyethylene glycol (MIRALAX / GLYCOLAX) 17 g packet Take 17 g by mouth daily as needed for moderate constipation.    [provider]  QVAR REDIHALER 80 MCG/ACT inhaler Inhale 2 puffs into the lungs 2 (two) times daily.    [provider]  sildenafil (VIAGRA) 50 MG tablet Take 50 mg by mouth daily as needed for erectile dysfunction.    [provider]  tamsulosin (FLOMAX) 0.4 MG CAPS capsule Take 0.4 mg by mouth at bedtime.    [provider]  Tiotropium Bromide Monohydrate (SPIRIVA RESPIMAT) 1.25 MCG/ACT AERS Inhale 2 each into the lungs daily.    [provider]  tiZANidine (ZANAFLEX) 4 MG tablet Take 4 mg by mouth every 8 (eight) hours as needed for muscle spasms.    [provider]  traMADol (ULTRAM) 50 MG tablet Take 50 mg by mouth every 6 (six) hours as needed for moderate pain.    [provider]  venlafaxine (EFFEXOR) 75 MG tablet Take 75 mg by mouth at bedtime. 05/06/22 08/04/22  [provider]  zileuton (ZYFLO) 600 MG TABS Take 1,200 mg by mouth 2 (two) times daily.    [provider]      Allergies    Fluogen [influenza virus vaccine]    Review of Systems   Review of Systems  Cardiovascular:  Positive for chest pain.  All other systems reviewed and are negative.   Physical Exam Updated Vital Signs BP 130/72   Pulse 61   Temp 97.7 F (36.5 C) (Oral)   Resp 18   Ht 5\' 9"  (1.753 m)   Wt 99.8 kg   SpO2 98%   BMI 32.49 kg/m  Physical Exam Vitals and nursing note reviewed.  Constitutional:      General: He is not in acute distress.    Appearance: Normal appearance.  HENT:     Head: Normocephalic and atraumatic.     Mouth/Throat:     Mouth: Mucous membranes are moist.  Eyes:      Conjunctiva/sclera: Conjunctivae normal.     Pupils: Pupils are equal, round, and reactive to light.  Cardiovascular:     Rate and Rhythm: Normal rate and regular rhythm.     Pulses: Normal pulses.     Heart sounds: Normal heart sounds.  Pulmonary:     Effort: Pulmonary effort is normal.     Breath sounds: Normal breath sounds.  Abdominal:     Palpations: Abdomen is soft.     Tenderness: There is no abdominal tenderness.  Musculoskeletal:        General:  Normal range of motion.     Cervical back: Normal range of motion.  Skin:    General: Skin is warm and dry.     Findings: No rash.  Neurological:     General: No focal deficit present.     Mental Status: He is alert.  Psychiatric:        Mood and Affect: Mood normal.        Behavior: Behavior normal.    ED Results / Procedures / Treatments   Labs (all labs ordered are listed, but only abnormal results are displayed) Labs Reviewed  BASIC METABOLIC PANEL - Abnormal; Notable for the following components:      Result Value   CO2 20 (*)    Glucose, Bld 167 (*)    Creatinine, Ser 1.33 (*)    All other components within normal limits  RESP PANEL BY RT-PCR (RSV, FLU A&B, COVID)  RVPGX2  CBC  HEPATIC FUNCTION PANEL  BRAIN NATRIURETIC PEPTIDE  D-DIMER, QUANTITATIVE  LIPASE, BLOOD  TROPONIN I (HIGH SENSITIVITY)  TROPONIN I (HIGH SENSITIVITY)    EKG EKG Interpretation Date/Time:  Tuesday July 21 2023 13:49:06 EST Ventricular Rate:  77 PR Interval:  182 QRS Duration:  90 QT Interval:  384 QTC Calculation: 435 R Axis:   -55  Text Interpretation: Sinus arrhythmia Confirmed by Virgina Norfolk [phone]) on 07/21/2023 1:51:54 PM  Radiology DG Chest Portable 1 View Result Date: 07/21/2023 CLINICAL DATA:  Chest pain. EXAM: PORTABLE CHEST 1 VIEW COMPARISON:  None Available. FINDINGS: There is mild cardiomegaly with mild central vascular congestion. No focal consolidation, pleural effusion or pneumothorax. No acute osseous  pathology. Cervical ACDF. IMPRESSION: Mild cardiomegaly with mild central vascular congestion. Electronically Signed   By: Elgie Collard M.D.   On: 07/21/2023 14:12    Procedures Procedures: not indicated.   Medications Ordered in ED Medications  nitroGLYCERIN (NITROSTAT) SL tablet 0.4 mg (has no administration in time range)  atorvastatin (LIPITOR) tablet 40 mg (40 mg Oral Given 07/21/23 2116)  melatonin tablet 9 mg (9 mg Oral Given 07/21/23 2214)  aspirin EC tablet 325 mg (325 mg Oral Given 07/21/23 1420)  pantoprazole (PROTONIX) injection 40 mg (40 mg Intravenous Given 07/21/23 1558)  pantoprazole (PROTONIX) injection 40 mg (40 mg Intravenous Given 07/21/23 2016)  tamsulosin (FLOMAX) capsule 0.4 mg (0.4 mg Oral Given 07/21/23 2116)  atenolol (TENORMIN) tablet 25 mg (25 mg Oral Given 07/21/23 2116)    ED Course/ Medical Decision Making/ A&P                                 Medical Decision Making Amount and/or Complexity of Data Reviewed Labs: ordered. Radiology: ordered. ECG/medicine tests: ordered.  Risk OTC drugs. Prescription drug management.   This patient presents to the ED for concern of chest pain, this involves an extensive number of treatment options, and is a complaint that carries with it a high risk of complications and morbidity.   Differential diagnosis includes: ACS, pulmonary embolism, cardiac arrhythmia, pneumonia, pneumothorax, costochondritis, pleurisy, GERD, COVID, flu, RSV, etc.   Comorbidities  See HPI above   Additional History  Additional history obtained from prior records.   Cardiac Monitoring / EKG  The patient was maintained on a cardiac monitor.  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   Lab Tests  I ordered and personally interpreted  labs.  The pertinent results include:   Troponin of 5, delta troponin of 5 D-dimer 0.35 Cr of 1.33,  otherwise BMP is within normal limits Hepatic panel and lipase are unremarkable CBC is unremarkable Negative respiratory panel   Imaging Studies  I ordered imaging studies including CXR  I independently visualized and interpreted imaging which showed: Mild cardiomegaly with mild central vascular congestion. I agree with the radiologist interpretation   Consultations  I requested consultation with Dr. Sandi Mariscal with cardiology at [facility],  and discussed lab and imaging findings as well as pertinent plan - they recommend: observation for chest pain rule out either at a [facility] facility or [facility]. Patient would prefer to go to [facility]  I then spoke with Claudine Mouton, admitting APP for cardiology at [facility] - patient on wait list for bed at [facility], will most likely be available tomorrow. Patient agreeable with plan.   Problem List / ED Course / Critical Interventions / Medication Management  Left sided chest pain underneath the breast that started after eating lunch this afternoon.  He did not take any medications prior to arrival.  He was seen at a [facility] ED 1/18 for similar concerns.  He had an inconclusive stress test and was advised to follow-up with his cardiologist in outpatient setting for a nuclear med study. He is here with the same complaint today, I spoke with on-call cardiology at [facility] who recommended admission for further testing. EF of 50-55%, STEMI and distal RCA stent in 2019 I ordered medications including: Aspirin for chest pain  Reevaluation of the patient after these medicines showed that the patient improved I have reviewed the patients home medicines and have made adjustments as needed   Social Determinants of Health  Access to healthcare   Test / Admission - Considered  Patient is agreeable for plan for admission.       Final Clinical Impression(s) / ED Diagnoses Final diagnoses:  Chest pain, unspecified type    Rx / DC Orders ED Discharge Orders      None         Maxwell Marion, PA-C 07/21/23 2320

## 2023-07-21 NOTE — ED Notes (Signed)
Lab notified to add-on BNP, d-dimer to previously collected blood  sample.

## 2023-07-22 NOTE — ED Provider Notes (Signed)
  Physical Exam  BP (!) 153/69   Pulse 65   Temp 97.7 F (36.5 C) (Oral)   Resp 14   Ht 5\' 9"  (1.753 m)   Wt 99.8 kg   SpO2 97%   BMI 32.49 kg/m     Procedures  Procedures  ED Course / MDM    Medical Decision Making Amount and/or Complexity of Data Reviewed Labs: ordered. Radiology: ordered. ECG/medicine tests: ordered.  Risk OTC drugs. Prescription drug management.    63 year old male presenting to the emergency department with chest pain.  The patient had previously had an inconclusive stress test with [facility].  Had been accepted in transfer and was waiting on a bed.  He states that he is now pain-free and is requesting discharge AGAINST MEDICAL ADVICE.  Cardiology had been consulted overnight and had recommended admission for further diagnostic testing.  The patient was informed of the risk of leaving AGAINST MEDICAL ADVICE to include cardiac arrhythmia, ACS, cardiac arrest leading to potential morbidity and mortality.  The patient was understanding and still would like to sign out AMA.  He has plans to follow-up with nuclear medicine for a nuc med stress test and I encouraged him to return to the emergency department in the event any recurrence of symptoms.      Ernie Avena, MD 07/22/23 [phone]

## 2023-07-22 NOTE — ED Notes (Addendum)
Spoke with Hope from [facility] transfer center and gave update on pt. Status.

## 2023-07-22 NOTE — ED Notes (Addendum)
Pt expressed desire to go home. Dr. Karene Fry to bedside and spoke with pt about leaving AMA. Pt agreeable to sign AMA paperwork. This RN also explained to pt the risks of leaving AMA. Pt signed AMA paperwork. PIV removed.   Robina Ade, RN

## 2023-07-22 NOTE — ED Notes (Signed)
[facility] aware of transport.

## 2023-07-24 ENCOUNTER — Ambulatory Visit
Admission: RE | Admit: 2023-07-24 | Discharge: 2023-07-24 | Disposition: A | Discharge status: Home / Self Care | Payer: BC Managed Care – PPO | Source: Ambulatory Visit | Attending: Urology

## 2023-07-24 DIAGNOSIS — D49511 Neoplasm of unspecified behavior of right kidney: Secondary | ICD-10-CM

## 2023-07-24 MED ORDER — GADOPICLENOL 0.5 MMOL/ML IV SOLN
10.0000 mL | Freq: Once | INTRAVENOUS | Status: AC | PRN
  Administered 2023-07-24: 10 mL via INTRAVENOUS

## 2023-08-25 ENCOUNTER — Other Ambulatory Visit: Payer: Self-pay | Admitting: Urology

## 2023-08-25 DIAGNOSIS — N2889 Other specified disorders of kidney and ureter: Secondary | ICD-10-CM

## 2023-09-23 ENCOUNTER — Ambulatory Visit
Admission: RE | Admit: 2023-09-23 | Discharge: 2023-09-23 | Disposition: A | Discharge status: Home / Self Care | Source: Ambulatory Visit | Attending: Urology | Admitting: Urology

## 2023-09-23 DIAGNOSIS — N2889 Other specified disorders of kidney and ureter: Secondary | ICD-10-CM

## 2023-09-23 HISTORY — PX: IR RADIOLOGIST EVAL & MGMT: IMG5224

## 2023-09-23 NOTE — Progress Notes (Signed)
 Reason for visit: R renal mass evaluation    Care Team (s) PCP: Wilfred Curtis, MD Urology: Rene Paci, MD Cardiology: Oretha Ellis, PA-C  History of Present Illness:  Hunter Krause is a 63 y.o. male w PMHx of T2DM on metformin, HTN, CAD s/p PCI and DES (2019) on ASA 81, asthma (severe), back pain s/p L2-3 fusion (04/02/23) and renal stones s/p lithotripsies. Pt is joined by their spouse, Hunter Krause, who report that they were sent from their cardiology clinic to ER in 07/2023 w severe RLQ pain. Investigations including CT AP (07/23/2023) revealed a 1 cm incidental R renal lesion.   He was seen in urological followup, had multiphase MR abdomen (07/24/23) confirming a 1.2 cm enhancing R renal mass suspicious for RCC.   He was referred for minimally invasive treatment given his comorbidities. Pt denies any unintentional weight loss, hematuria or localizing symptoms.   Review of Systems: A 12-point ROS discussed, and pertinent positives are indicated in the HPI above.  All other systems are negative.  Past Medical History:  Diagnosis Date   Anemia    Arthritis    Asthma 1994   allergy induced asthma   Coronary artery disease    Diabetes mellitus without complication (HCC) type 2    GERD (gastroesophageal reflux disease)    GI bleed 12/01/2021   [facility]   GI bleed 12/01/2021   [facility]mit [facility]   Gout    no recent flares   History of kidney stones    Hypertension    treated with hydrocholothiazide   Myocardial infarction [facility]) 2019   Pneumonia    Sleep apnea    treated with surgery. No problems now.    Past Surgical History:  Procedure Laterality Date   ARTHROSCOPY KNEE W/ DRILLING  2004   right knee   cervical neck fusion     C 3 to C 5 has plates and screws   CORONARY ANGIOPLASTY  2019   stent x 1   CYSTOSCOPY/URETEROSCOPY/HOLMIUM LASER/STENT PLACEMENT Left 09/10/2020   Procedure: CYSTOSCOPY LEFT URETEROSCOPY/HOLMIUM LASER/STENT  PLACEMENT;  Surgeon: Crista Elliot, MD;  Location[facility];  Service: Urology;  Laterality: Left;   CYSTOSCOPY/URETEROSCOPY/HOLMIUM LASER/STENT PLACEMENT Bilateral 05/28/2022   Procedure: CYSTOSCOPY/ RETROGRADE/URETEROSCOPY/HOLMIUM LASER/STENT PLACEMENT;  Surgeon: Rene Paci, MD;  [facility];  Service: Urology;  Laterality: Bilateral;   EXTRACORPOREAL SHOCK WAVE LITHOTRIPSY Left 09/03/2020   Procedure: LEFT EXTRACORPOREAL SHOCK WAVE LITHOTRIPSY (ESWL);  Surgeon: Crista Elliot, MD;  Location: [facility];  Service: Urology;  Laterality: Left;   IR RADIOLOGIST EVAL & MGMT  09/23/2023   LITHOTRIPSY  2001   lower back  2020   fusion L 4 to L 6   NASAL SEPTUM SURGERY  2005   palate remove for osa    Allergies: Fluogen [influenza virus vaccine]  Medications: Prior to Admission medications   Medication Sig Start Date End Date Taking? Authorizing Provider  albuterol (PROVENTIL HFA;VENTOLIN HFA) 108 (90 BASE) MCG/ACT inhaler Inhale 2 puffs into the lungs every 6 (six) hours as needed for wheezing or shortness of breath.    [provider]  albuterol (PROVENTIL) (2.5 MG/3ML) 0.083% nebulizer solution Take 2.5 mg by nebulization every 6 (six) hours as needed for wheezing or shortness of breath. 11/29/21 03/04/23  [provider]  allopurinol (ZYLOPRIM) 300 MG tablet Take 300 mg by mouth daily.    [provider]  Ascorbic Acid (VITAMIN  C PO) Take 1 tablet by mouth daily.    [provider]  aspirin EC 81 MG tablet Take 81 mg by mouth daily.    [provider]  atenolol (TENORMIN) 25 MG tablet Take 25 mg by mouth daily.    [provider]  atorvastatin (LIPITOR) 40 MG tablet Take 40 mg by mouth every evening.    [provider]  calcium carbonate (TUMS - DOSED IN MG ELEMENTAL CALCIUM) 500 MG chewable tablet Chew 1 tablet by mouth 3 (three) times daily as needed for indigestion or  heartburn.    [provider]  Cholecalciferol (VITAMIN D3 PO) Take 1 tablet by mouth daily.    [provider]  clotrimazole (MYCELEX) 10 MG troche Take 10 mg by mouth 5 (five) times daily as needed (thrush).    [provider]  diclofenac Sodium (VOLTAREN) 1 % GEL Apply 2 g topically daily as needed (pain).    [provider]  EPINEPHrine 0.3 mg/0.3 mL IJ SOAJ injection Inject 0.3 mg into the muscle as needed for anaphylaxis.    [provider]  ferrous sulfate 325 (65 FE) MG EC tablet Take 325 mg by mouth daily.    [provider]  gabapentin (NEURONTIN) 300 MG capsule Take 300 mg by mouth daily as needed (pain).    [provider]  Ketorolac Tromethamine 15.75 MG/SPRAY SOLN Place 1 spray into the nose every 8 (eight) hours as needed (kidney stone pain).    [provider]  Melatonin 10 MG TABS Take 10 mg by mouth at bedtime as needed (sleep).    [provider]  metFORMIN (GLUCOPHAGE-XR) 500 MG 24 hr tablet Take 1,000 mg by mouth daily with breakfast.    [provider]  nitroGLYCERIN (NITROSTAT) 0.4 MG SL tablet Place 0.4 mg under the tongue every 5 (five) minutes as needed for chest pain.    [provider]  Olodaterol HCl (STRIVERDI RESPIMAT) 2.5 MCG/ACT AERS Inhale 2 each into the lungs daily.    [provider]  olopatadine (PATANOL) 0.1 % ophthalmic solution Place 1 drop into both eyes 2 (two) times daily. 05/02/22   [provider]  ondansetron (ZOFRAN-ODT) 4 MG disintegrating tablet Take 4 mg by mouth every 4 (four) hours as needed for nausea or vomiting.    [provider]  oxybutynin (DITROPAN) 5 MG tablet Take 1 tablet (5 mg total) by mouth every 8 (eight) hours as needed for bladder spasms. 05/28/22   Rene Paci, MD  oxyCODONE-acetaminophen (PERCOCET) 5-325 MG tablet Take 1 tablet by mouth every 4 (four) hours as needed for severe pain. 05/28/22    Rene Paci, MD  pantoprazole (PROTONIX) 40 MG tablet Take 40 mg by mouth daily.    [provider]  polyethylene glycol (MIRALAX / GLYCOLAX) 17 g packet Take 17 g by mouth daily as needed for moderate constipation.    [provider]  QVAR REDIHALER 80 MCG/ACT inhaler Inhale 2 puffs into the lungs 2 (two) times daily.    [provider]  sildenafil (VIAGRA) 50 MG tablet Take 50 mg by mouth daily as needed for erectile dysfunction.    [provider]  tamsulosin (FLOMAX) 0.4 MG CAPS capsule Take 0.4 mg by mouth at bedtime.    [provider]  Tiotropium Bromide Monohydrate (SPIRIVA RESPIMAT) 1.25 MCG/ACT AERS Inhale 2 each into the lungs daily.    [provider]  tiZANidine (ZANAFLEX) 4 MG tablet Take 4  mg by mouth every 8 (eight) hours as needed for muscle spasms.    [provider]  traMADol (ULTRAM) 50 MG tablet Take 50 mg by mouth every 6 (six) hours as needed for moderate pain.    [provider]  venlafaxine (EFFEXOR) 75 MG tablet Take 75 mg by mouth at bedtime. 05/06/22 08/04/22  [provider]  zileuton (ZYFLO) 600 MG TABS Take 1,200 mg by mouth 2 (two) times daily.    [provider]     No family history on file.  Social History   Socioeconomic History   Marital status: Married    Spouse name: Not on file   Number of children: Not on file   Years of education: Not on file   Highest education level: Not on file  Occupational History   Not on file  Tobacco Use   Smoking status: Never   Smokeless tobacco: Never  Vaping Use   Vaping status: Never Used  Substance and Sexual Activity   Alcohol use: Yes    Comment: occasionally   Drug use: No   Sexual activity: Not on file  Other Topics Concern   Not on file  Social History Narrative   Not on file   Social Drivers of Health   Financial Resource Strain: Low Risk  (12/03/2022)   Received from [facility]   Overall  Financial Resource Strain (CARDIA)    Difficulty of Paying Living Expenses: Not hard at all  Food Insecurity: Low Risk  (07/23/2023)   Received from [facility]   Hunger Vital Sign    Worried About Running Out of Food in the Last Year: Never true    Ran Out of Food in the Last Year: Never true  Transportation Needs: No Transportation Needs (07/23/2023)   Received from [employer]    In the past 12 months, has lack of reliable transportation kept you from medical appointments, meetings, work or from getting things needed for daily living? : No  Physical Activity: Unknown (12/03/2022)   Received from [facility]   Exercise Vital Sign    Days of Exercise per Week: Patient declined    Minutes of Exercise per Session: 10 min  Stress: No Stress Concern Present (04/02/2023)   Received from [facility] of Occupational Health - Occupational Stress Questionnaire    Feeling of Stress : Not at all  Social Connections: Socially Integrated (12/03/2022)   Received from [facility]   Social Network    How would you rate your social network (family, work, friends)?: Good participation with social networks    ECOG Status: 0 - Asymptomatic  Review of Systems As above  Vital Signs: BP 132/84   Pulse 65   Temp 97.7 F (36.5 C) (Oral)   Resp 17   SpO2 97%   Physical Exam  General: WN, NAD  CV: RRR on monitor Pulm: normal work of breathing on RA Abd: S, ND, NT MSK: Grossly normal Psych: Appropriate affect.   Imaging:  MR Abd W WO, 08/24/23 IMPRESSION:  1. Heterogeneously enhancing lateral right interpolar renal lesion measures 1.2 x 1.0 x 1.5 cm, suspicious for primary renal neoplasm.  2. No evidence of metastatic disease in the abdomen.     US Renal, 07/16/23 Incidental, small posterior exophytic R renal mass.    IR Radiologist Eval & Mgmt Result Date: 09/23/2023 EXAM: NEW PATIENT OFFICE VISIT CHIEF COMPLAINT: See below HISTORY OF  PRESENT ILLNESS: See below  REVIEW OF SYSTEMS: See below PHYSICAL EXAMINATION: See below ASSESSMENT AND PLAN: Please refer to completed note in the electronic medical record on [facility] Roanna Banning, MD [facility] Radiology Electronically Signed   By: Roanna Banning M.D.   On: 09/23/2023 09:21    Labs:  CBC: Recent Labs    07/21/23 1351  WBC 10.2  HGB 14.5  HCT 42.9  PLT 215    COAGS: No results for input(s): "INR", "APTT" in the last 8760 hours.  BMP: Recent Labs    07/21/23 1351  NA 136  K 3.6  CL 105  CO2 20*  GLUCOSE 167*  BUN 18  CALCIUM 9.2  CREATININE 1.33*  GFRNONAA >60   Assessment and Plan:  63 y/o M w PMHx of T2DM on metformin, HTN, CAD s/p PCI and DES (2019) on ASA 81, asthma (severe), back pain s/p L2-3 fusion (04/02/23) and renal stones s/p lithotripsies. 1.2 cm enhancing R renal mass suspicious for RCC incidentally found on ER workup. Baseline sCr ~1.1.   He is interested in pursuing a minimally-invasive option for the treatment of his renal mass at this time, and is curious about percutaneous ablation   *multiphase MR (07/24/23) already performed and reviewed. No additional imaging required. *comorbid, will confirm cardiac clearance. Pt is followed by [facility] Cardiology and underwent clearance for spine surgery in 03/2023. *Pre-anesthesia evaluation and pre op Labs (CBC, CMP, Coags) *once cleared, OK to proceed to schedule for RIGHT RENAL MASS MICROWAVE ABLATION with possible BIOPSY *on Metformin. Request 2 day hold prior to procedure and resume 2 days post op, to reduce risk of LA with iodinated contrast  *ASA washout *Anticipate overnight admission post procedure  Thank you for this interesting consult.  I greatly enjoyed meeting MOUHAMADOU GITTLEMAN and look forward to participating in their care.  A copy of this report was sent to the requesting provider on this date.  Electronically Signed:  Roanna Banning, MD [facility] Radiology   Pager. [phone] Clinic. [phone]  I spent a total of  60 Minutes  in face to face in clinical consultation, greater than 50% of which was counseling/coordinating care for Mr Aurel[facility] management for RIGHT renal mass

## 2023-10-12 ENCOUNTER — Other Ambulatory Visit (HOSPITAL_COMMUNITY): Payer: Self-pay | Admitting: Interventional Radiology

## 2023-10-12 DIAGNOSIS — N2889 Other specified disorders of kidney and ureter: Secondary | ICD-10-CM

## 2023-11-17 NOTE — Progress Notes (Signed)
 Anesthesia Review:  PCP: Cardiologist : Hunter Krause- LOV 09/28/23  Pulm- Hunter Krause LVO 07/28/23   PPM/ ICD: Device Orders: Rep Notified:  Chest x-ray : 07/21/23- 1 view  EKG : 07/21/23  Echo : 07/18/23  Stress test: 08/03/23  Cardiac Cath :   Activity level:  Sleep Study/ CPAP : Fasting Blood Sugar :      / Checks Blood Sugar -- times a day:   DM- type  Hgba1c-  Metformin- none am of surgery   Blood Thinner/ Instructions /Last Dose: ASA / Instructions/ Last Dose :

## 2023-11-18 ENCOUNTER — Other Ambulatory Visit: Payer: Self-pay | Admitting: Radiology

## 2023-11-18 DIAGNOSIS — Z01818 Encounter for other preprocedural examination: Secondary | ICD-10-CM

## 2023-11-18 DIAGNOSIS — N2889 Other specified disorders of kidney and ureter: Secondary | ICD-10-CM

## 2023-11-18 NOTE — Progress Notes (Signed)
 Surgery orders requested with Ernestina Headland in radiology.

## 2023-11-18 NOTE — Patient Instructions (Signed)
 SURGICAL WAITING ROOM VISITATION  Patients having surgery or a procedure may have no more than 2 support people in the waiting area - these visitors may rotate.    Children under the age of 73 must have an adult with them who is not the patient.  Due to an increase in RSV and influenza rates and associated hospitalizations, children ages 75 and under may not visit patients in [facility] hospitals.  Visitors with respiratory illnesses are discouraged from visiting and should remain at home.  If the patient needs to stay at the hospital during part of their recovery, the visitor guidelines for inpatient rooms apply. Pre-op nurse will coordinate an appropriate time for 1 support person to accompany patient in pre-op.  This support person may not rotate.    Please refer to the [facility] website for the visitor guidelines for Inpatients (after your surgery is over and you are in a regular room).       Your procedure is scheduled on:  12/02/2022    Report to [facility] Main Entrance    Report to admitting at  0630 AM   Call this number if you have problems the morning of surgery [phone]   Do not eat food or drink liquids  :After Midnight.                               If you have questions, please contact your s     Oral Hygiene is also important to reduce your risk of infection.                                    Remember - BRUSH YOUR TEETH THE MORNING OF SURGERY WITH YOUR REGULAR TOOTHPASTE  DENTURES WILL BE REMOVED PRIOR TO SURGERY PLEASE DO NOT APPLY "Poly grip" OR ADHESIVES!!!   Do NOT smoke after Midnight   Stop all vitamins and herbal supplements 7 days before surgery.   Take these medicines the morning of surgery with A SIP OF WATER:   inhalers as usual and bring, eye drops if needed, protonix  if needed , zyflo              Metformin- none am of surgery   DO NOT TAKE ANY ORAL DIABETIC MEDICATIONS DAY OF YOUR SURGERY  Bring CPAP mask and tubing day  of surgery.                              You may not have any metal on your body including hair pins, jewelry, and body piercing             Do not wear make-up, lotions, powders, perfumes/cologne, or deodorant  Do not wear nail polish including gel and S&S, artificial/acrylic nails, or any other type of covering on natural nails including finger and toenails. If you have artificial nails, gel coating, etc. that needs to be removed by a nail salon please have this removed prior to surgery or surgery may need to be canceled/ delayed if the surgeon/ anesthesia feels like they are unable to be safely monitored.   Do not shave  48 hours prior to surgery.               Men may shave face and neck.   Do not bring valuables to  the hospital. [facility] IS NOT             RESPONSIBLE   FOR VALUABLES.   Contacts, glasses, dentures or bridgework may not be worn into surgery.   Bring small overnight bag day of surgery.   DO NOT BRING YOUR HOME MEDICATIONS TO THE HOSPITAL. PHARMACY WILL DISPENSE MEDICATIONS LISTED ON YOUR MEDICATION LIST TO YOU DURING YOUR ADMISSION IN THE HOSPITAL!    Patients discharged on the day of surgery will not be allowed to drive home.  Someone NEEDS to stay with you for the first 24 hours after anesthesia.   Special Instructions: Bring a copy of your healthcare power of attorney and living will documents the day of surgery if you haven't scanned them before.              Please read over the following fact sheets you were given: IF YOU HAVE QUESTIONS ABOUT YOUR PRE-OP INSTRUCTIONS PLEASE CALL [phone]   If you received a COVID test during your pre-op visit  it is requested that you wear a mask when out in public, stay away from anyone that may not be feeling well and notify your surgeon if you develop symptoms. If you test positive for Covid or have been in contact with anyone that has tested positive in the last 10 days please notify you surgeon.    [facility] -  Preparing for Surgery Before surgery, you can play an important role.  Because skin is not sterile, your skin needs to be as free of germs as possible.  You can reduce the number of germs on your skin by washing with CHG (chlorahexidine gluconate) soap before surgery.  CHG is an antiseptic cleaner which kills germs and bonds with the skin to continue killing germs even after washing. Please DO NOT use if you have an allergy to CHG or antibacterial soaps.  If your skin becomes reddened/irritated stop using the CHG and inform your nurse when you arrive at Short Stay. Do not shave (including legs and underarms) for at least 48 hours prior to the first CHG shower.  You may shave your face/neck. Please follow these instructions carefully:  1.  Shower with CHG Soap the night before surgery and the  morning of Surgery.  2.  If you choose to wash your hair, wash your hair first as usual with your  normal  shampoo.  3.  After you shampoo, rinse your hair and body thoroughly to remove the  shampoo.                           4.  Use CHG as you would any other liquid soap.  You can apply chg directly  to the skin and wash                       Gently with a scrungie or clean washcloth.  5.  Apply the CHG Soap to your body ONLY FROM THE NECK DOWN.   Do not use on face/ open                           Wound or open sores. Avoid contact with eyes, ears mouth and genitals (private parts).                       Wash face,  Genitals (private parts) with your  normal soap.             6.  Wash thoroughly, paying special attention to the area where your surgery  will be performed.  7.  Thoroughly rinse your body with warm water from the neck down.  8.  DO NOT shower/wash with your normal soap after using and rinsing off  the CHG Soap.                9.  Pat yourself dry with a clean towel.            10.  Wear clean pajamas.            11.  Place clean sheets on your bed the night of your first shower and do not  sleep  with pets. Day of Surgery : Do not apply any lotions/deodorants the morning of surgery.  Please wear clean clothes to the hospital/surgery center.  FAILURE TO FOLLOW THESE INSTRUCTIONS MAY RESULT IN THE CANCELLATION OF YOUR SURGERY PATIENT SIGNATURE_________________________________  NURSE SIGNATURE__________________________________  ________________________________________________________________________

## 2023-11-24 ENCOUNTER — Other Ambulatory Visit: Payer: Self-pay

## 2023-11-24 ENCOUNTER — Encounter (HOSPITAL_COMMUNITY)
Admission: RE | Admit: 2023-11-24 | Discharge: 2023-11-24 | Disposition: A | Discharge status: Home / Self Care | Source: Ambulatory Visit | Attending: Interventional Radiology | Admitting: Interventional Radiology

## 2023-11-24 ENCOUNTER — Encounter (HOSPITAL_COMMUNITY): Payer: Self-pay

## 2023-11-24 DIAGNOSIS — Z7984 Long term (current) use of oral hypoglycemic drugs: Secondary | ICD-10-CM | POA: Insufficient documentation

## 2023-11-24 DIAGNOSIS — G4733 Obstructive sleep apnea (adult) (pediatric): Secondary | ICD-10-CM | POA: Diagnosis not present

## 2023-11-24 DIAGNOSIS — I10 Essential (primary) hypertension: Secondary | ICD-10-CM | POA: Insufficient documentation

## 2023-11-24 DIAGNOSIS — E119 Type 2 diabetes mellitus without complications: Secondary | ICD-10-CM | POA: Insufficient documentation

## 2023-11-24 DIAGNOSIS — N2889 Other specified disorders of kidney and ureter: Secondary | ICD-10-CM | POA: Insufficient documentation

## 2023-11-24 DIAGNOSIS — J45909 Unspecified asthma, uncomplicated: Secondary | ICD-10-CM | POA: Diagnosis not present

## 2023-11-24 DIAGNOSIS — Z955 Presence of coronary angioplasty implant and graft: Secondary | ICD-10-CM | POA: Diagnosis not present

## 2023-11-24 DIAGNOSIS — Z01818 Encounter for other preprocedural examination: Secondary | ICD-10-CM

## 2023-11-24 DIAGNOSIS — Z01812 Encounter for preprocedural laboratory examination: Secondary | ICD-10-CM | POA: Insufficient documentation

## 2023-11-24 DIAGNOSIS — I2510 Atherosclerotic heart disease of native coronary artery without angina pectoris: Secondary | ICD-10-CM | POA: Insufficient documentation

## 2023-11-24 HISTORY — DX: Malignant (primary) neoplasm, unspecified: C80.1

## 2023-11-24 LAB — CBC
HCT: 48.9 % (ref 39.0–52.0)
Hemoglobin: 15.7 g/dL (ref 13.0–17.0)
MCH: 30.1 pg (ref 26.0–34.0)
MCHC: 32.1 g/dL (ref 30.0–36.0)
MCV: 93.9 fL (ref 80.0–100.0)
Platelets: 255 10*3/uL (ref 150–400)
RBC: 5.21 MIL/uL (ref 4.22–5.81)
RDW: 14.1 % (ref 11.5–15.5)
WBC: 19.2 10*3/uL — ABNORMAL HIGH (ref 4.0–10.5)
nRBC: 0 % (ref 0.0–0.2)

## 2023-11-24 LAB — BASIC METABOLIC PANEL WITH GFR
Anion gap: 12 (ref 5–15)
BUN: 17 mg/dL (ref 8–23)
CO2: 21 mmol/L — ABNORMAL LOW (ref 22–32)
Calcium: 9.5 mg/dL (ref 8.9–10.3)
Chloride: 105 mmol/L (ref 98–111)
Creatinine, Ser: 0.98 mg/dL (ref 0.61–1.24)
GFR, Estimated: >60 mL/min (ref >60)
Glucose, Bld: 112 mg/dL — ABNORMAL HIGH (ref 70–99)
Potassium: 4.4 mmol/L (ref 3.5–5.1)
Sodium: 138 mmol/L (ref 135–145)

## 2023-11-24 LAB — PROTIME-INR
INR: 1 (ref 0.8–1.2)
Prothrombin Time: 13.5 s (ref 11.4–15.2)

## 2023-11-24 LAB — HEMOGLOBIN A1C
Hgb A1c MFr Bld: 6.1 % — ABNORMAL HIGH (ref 4.8–5.6)
Mean Plasma Glucose: 128.37 mg/dL

## 2023-11-24 LAB — GLUCOSE, CAPILLARY: Glucose-Capillary: 119 mg/dL — ABNORMAL HIGH (ref 70–99)

## 2023-11-25 NOTE — Progress Notes (Signed)
 Anesthesia Chart Review   Case: [id number] Date/Time: 12/02/23 0815   Procedure: CT WITH ANESTHESIA (Right)   Anesthesia type: General   Diagnosis: Renal mass [N28.89]   Pre-op diagnosis: right renal mass   [facility]   Surgeons: Radiologist, Medication, MD       DISCUSSION:63 y.o. never smoker with h/o asthma, sleep apnea, HTN, CAD s/p DES 03/2018, DM II, right renal mass scheduled for above procedure 12/02/2023.   Pt last seen by cardiology 09/28/2023. Stable at this visit, no changes made.  1 year follow up recommended. Negative stress test 08/03/2023.   Per notes pt with h/o chronic leukocytosis. Pt afebrile, feeling well. Labs forwarded to PCP.  VS: BP 135/80   Pulse 68   Temp 37.1 C (Oral)   Resp 16   Ht 5\' 9"  (1.753 m)   Wt 98.9 kg   SpO2 97%   BMI 32.19 kg/m   PROVIDERS: Dyana Glade, MD is PCP    LABS: Labs reviewed: Acceptable for surgery. (all labs ordered are listed, but only abnormal results are displayed)  Labs Reviewed  CBC - Abnormal; Notable for the following components:      Result Value   WBC 19.2 (*)    All other components within normal limits  BASIC METABOLIC PANEL WITH GFR - Abnormal; Notable for the following components:   CO2 21 (*)    Glucose, Bld 112 (*)    All other components within normal limits  HEMOGLOBIN A1C - Abnormal; Notable for the following components:   Hgb A1c MFr Bld 6.1 (*)    All other components within normal limits  GLUCOSE, CAPILLARY - Abnormal; Notable for the following components:   Glucose-Capillary 119 (*)    All other components within normal limits  PROTIME-INR  TYPE AND SCREEN     IMAGES:   EKG:   CV:  Past Medical History:  Diagnosis Date   Anemia    Arthritis    Asthma 1994   allergy induced asthma   Cancer (HCC)    kidney lesion   Coronary artery disease    Diabetes mellitus without complication (HCC) type 2    GERD (gastroesophageal reflux disease)    GI bleed 12/01/2021    admit to [facility]   GI bleed 12/01/2021   admit to [facility]   Gout    no recent flares   History of kidney stones    Hypertension    treated with hydrocholothiazide   Myocardial infarction (HCC) 2019   Pneumonia    Sleep apnea    treated with surgery. No problems now.    Past Surgical History:  Procedure Laterality Date   ARTHROSCOPY KNEE W/ DRILLING  2004   right knee   cervical neck fusion     C 3 to C 5 has plates and screws   CHOLECYSTECTOMY     CORONARY ANGIOPLASTY  2019   stent x 1   CORONARY STENT INTERVENTION     CYSTOSCOPY/URETEROSCOPY/HOLMIUM LASER/STENT PLACEMENT Left 09/10/2020   Procedure: CYSTOSCOPY LEFT URETEROSCOPY/HOLMIUM LASER/STENT PLACEMENT;  Surgeon: Samson Croak, MD;  Location: [facility];  Service: Urology;  Laterality: Left;   CYSTOSCOPY/URETEROSCOPY/HOLMIUM LASER/STENT PLACEMENT Bilateral 05/28/2022   Procedure: CYSTOSCOPY/ RETROGRADE/URETEROSCOPY/HOLMIUM LASER/STENT PLACEMENT;  Surgeon: Adelbert Homans, MD;  [facility];  Service: Urology;  Laterality: Bilateral;   EXTRACORPOREAL SHOCK WAVE LITHOTRIPSY Left 09/03/2020   Procedure: LEFT EXTRACORPOREAL SHOCK WAVE LITHOTRIPSY (ESWL);  Surgeon: Samson Croak, MD;  Location: [facility];  Service: Urology;  Laterality: Left;   IR RADIOLOGIST EVAL & MGMT  09/23/2023   LITHOTRIPSY  2001   lower back  2020   fusion L 4 to L 6   NASAL SEPTUM SURGERY  2005   palate remove for osa    MEDICATIONS:  albuterol (PROVENTIL HFA;VENTOLIN HFA) 108 (90 BASE) MCG/ACT inhaler   aspirin  EC 81 MG tablet   atenolol  (TENORMIN ) 25 MG tablet   atorvastatin  (LIPITOR) 40 MG tablet   calcium  carbonate (TUMS - DOSED IN MG ELEMENTAL CALCIUM ) 500 MG chewable tablet   Cholecalciferol (VITAMIN D3 PO)   clotrimazole (MYCELEX) 10 MG troche   cyanocobalamin (VITAMIN B12) 1000 MCG tablet   diclofenac Sodium (VOLTAREN) 1 % GEL   EPINEPHrine 0.3 mg/0.3 mL IJ SOAJ injection    HYDROcodone-acetaminophen  (NORCO/VICODIN) 5-325 MG tablet   lisinopril (ZESTRIL) 5 MG tablet   Melatonin 10 MG TABS   metFORMIN (GLUCOPHAGE-XR) 500 MG 24 hr tablet   nitroGLYCERIN  (NITROSTAT ) 0.4 MG SL tablet   olopatadine (PATANOL) 0.1 % ophthalmic solution   pantoprazole  (PROTONIX ) 40 MG tablet   polyethylene glycol (MIRALAX / GLYCOLAX) 17 g packet   QVAR REDIHALER 80 MCG/ACT inhaler   sildenafil (VIAGRA) 50 MG tablet   tamsulosin  (FLOMAX ) 0.4 MG CAPS capsule   tetrahydrozoline-zinc (VISINE-AC) 0.05-0.25 % ophthalmic solution   Tezepelumab-ekko 210 MG/1. SOAJ   Tiotropium Bromide Monohydrate (SPIRIVA RESPIMAT) 1.25 MCG/ACT AERS   tiZANidine (ZANAFLEX) 4 MG tablet   zileuton (ZYFLO CR) 600 MG CR tablet   zinc sulfate, 50mg  elemental zinc, 220 (50 Zn) MG capsule   No current facility-administered medications for this encounter.   Chick Cotton Ward, PA-C [facility] Pre-Surgical Testing [phone]

## 2023-12-01 ENCOUNTER — Other Ambulatory Visit: Payer: Self-pay | Admitting: Radiology

## 2023-12-01 DIAGNOSIS — N2889 Other specified disorders of kidney and ureter: Secondary | ICD-10-CM

## 2023-12-01 NOTE — H&P (Signed)
 Chief Complaint: Right renal mass concerning for renal cell carcinoma; referred for image guided right renal mass microwave ablation  Referring Provider(s): Winter,C  Supervising Physician: Art Largo  Patient Status: [facility] - Out-pt  History of Present Illness: Hunter Krause is a 63 y.o. male with PMH sig for anemia, arthritis, asthma, CAD with prior MI/stenting 2019, DM, GERD, prior GI bleed, gout, nephrolithiasis, HTN, prior cervical/lumbar fusion surgery,  sleep apnea and recently noted incidental 1.2 cm right renal mass suspicious for RCC. He underwent consultation with Dr. Darylene Epley on 09/23/23 to discuss treatment options for his right renal mass and was deemed an appropriate candidate for image guided microwave ablation/possible biopsy. He presents today for the procedure.   *** Patient is Full Code  Past Medical History:  Diagnosis Date   Anemia    Arthritis    Asthma 1994   allergy induced asthma   Cancer (HCC)    kidney lesion   Coronary artery disease    Diabetes mellitus without complication (HCC) type 2    GERD (gastroesophageal reflux disease)    GI bleed 12/01/2021   admit to [facility]   GI bleed 12/01/2021   admit to [facility]   Gout    no recent flares   History of kidney stones    Hypertension    treated with hydrocholothiazide   Myocardial infarction (HCC) 2019   Pneumonia    Sleep apnea    treated with surgery. No problems now.    Past Surgical History:  Procedure Laterality Date   ARTHROSCOPY KNEE W/ DRILLING  2004   right knee   cervical neck fusion     C 3 to C 5 has plates and screws   CHOLECYSTECTOMY     CORONARY ANGIOPLASTY  2019   stent x 1   CORONARY STENT INTERVENTION     CYSTOSCOPY/URETEROSCOPY/HOLMIUM LASER/STENT PLACEMENT Left 09/10/2020   Procedure: CYSTOSCOPY LEFT URETEROSCOPY/HOLMIUM LASER/STENT PLACEMENT;  Surgeon: Samson Croak, MD;  Location: [facility];  Service: Urology;  Laterality: Left;    CYSTOSCOPY/URETEROSCOPY/HOLMIUM LASER/STENT PLACEMENT Bilateral 05/28/2022   Procedure: CYSTOSCOPY/ RETROGRADE/URETEROSCOPY/HOLMIUM LASER/STENT PLACEMENT;  Surgeon: Adelbert Homans, MD;  Location: [facility];  Service: Urology;  Laterality: Bilateral;   EXTRACORPOREAL SHOCK WAVE LITHOTRIPSY Left 09/03/2020   Procedure: LEFT EXTRACORPOREAL SHOCK WAVE LITHOTRIPSY (ESWL);  Surgeon: Samson Croak, MD;  Location: [facility];  Service: Urology;  Laterality: Left;   IR RADIOLOGIST EVAL & MGMT  09/23/2023   LITHOTRIPSY  2001   lower back  2020   fusion L 4 to L 6   NASAL SEPTUM SURGERY  2005   palate remove for osa    Allergies: Fluogen [influenza virus vaccine] and Shrimp (diagnostic)  Medications: Prior to Admission medications   Medication Sig Start Date End Date Taking? Authorizing Provider  albuterol (PROVENTIL HFA;VENTOLIN HFA) 108 (90 BASE) MCG/ACT inhaler Inhale 2 puffs into the lungs every 6 (six) hours as needed for wheezing or shortness of breath.   Yes [provider]  aspirin  EC 81 MG tablet Take 81 mg by mouth every evening.   Yes [provider]  atenolol  (TENORMIN ) 25 MG tablet Take 25 mg by mouth at bedtime.   Yes [provider]  atorvastatin  (LIPITOR) 40 MG tablet Take 40 mg by mouth every evening.   Yes [provider]  calcium  carbonate (TUMS - DOSED IN MG ELEMENTAL CALCIUM ) 500 MG chewable tablet Chew 1 tablet by mouth 3 (three) times  daily as needed for indigestion or heartburn.   Yes [provider]  Cholecalciferol (VITAMIN D3 PO) Take 1,000 Units by mouth in the morning.   Yes [provider]  clotrimazole (MYCELEX) 10 MG troche Take 10 mg by mouth 5 (five) times daily as needed (thrush).   Yes [provider]  cyanocobalamin (VITAMIN B12) 1000 MCG tablet Take 1,000 mcg by mouth in the morning.   Yes [provider]  diclofenac Sodium (VOLTAREN) 1 % GEL Apply 2 g topically  daily as needed (pain).   Yes [provider]  EPINEPHrine 0.3 mg/0.3 mL IJ SOAJ injection Inject 0.3 mg into the muscle as needed for anaphylaxis.   Yes [provider]  HYDROcodone-acetaminophen  (NORCO/VICODIN) 5-325 MG tablet Take 1 tablet by mouth every 6 (six) hours as needed.   Yes [provider]  lisinopril (ZESTRIL) 5 MG tablet Take 5 mg by mouth every evening.   Yes [provider]  Melatonin 10 MG TABS Take 10 mg by mouth at bedtime.   Yes [provider]  metFORMIN (GLUCOPHAGE-XR) 500 MG 24 hr tablet Take 1,000 mg by mouth in the morning and at bedtime.   Yes [provider]  nitroGLYCERIN  (NITROSTAT ) 0.4 MG SL tablet Place 0.4 mg under the tongue every 5 (five) minutes as needed for chest pain.   Yes [provider]  olopatadine (PATANOL) 0.1 % ophthalmic solution Place 1 drop into both eyes 2 (two) times daily as needed for allergies. 05/02/22  Yes [provider]  pantoprazole  (PROTONIX ) 40 MG tablet Take 40 mg by mouth daily as needed (indigestion/heartburn.).   Yes [provider]  polyethylene glycol (MIRALAX / GLYCOLAX) 17 g packet Take 17 g by mouth daily as needed for moderate constipation.   Yes [provider]  QVAR REDIHALER 80 MCG/ACT inhaler Inhale 2 puffs into the lungs 2 (two) times daily.   Yes [provider]  sildenafil (VIAGRA) 50 MG tablet Take 50 mg by mouth daily as needed for erectile dysfunction.   Yes [provider]  tamsulosin  (FLOMAX ) 0.4 MG CAPS capsule Take 0.4 mg by mouth at bedtime.   Yes [provider]  tetrahydrozoline-zinc (VISINE-AC) 0.05-0.25 % ophthalmic solution 2 drops 3 (three) times daily as needed (irritated eyes.).   Yes [provider]  Tezepelumab-ekko 210 MG/1. SOAJ Inject 210 mg into the skin every 28 (twenty-eight) days. 02/17/23  Yes [provider]  Tiotropium Bromide Monohydrate (SPIRIVA RESPIMAT) 1.25  MCG/ACT AERS Inhale 2 each into the lungs in the morning.   Yes [provider]  tiZANidine (ZANAFLEX) 4 MG tablet Take 4 mg by mouth at bedtime.   Yes [provider]  zileuton (ZYFLO CR) 600 MG CR tablet Take 1,200 mg by mouth in the morning and at bedtime. 10/23/16  Yes [provider]  zinc sulfate, 50mg  elemental zinc, 220 (50 Zn) MG capsule Take 220 mg by mouth in the morning.   Yes [provider]     No family history on file.  Social History   Socioeconomic History   Marital status: Married    Spouse name: Not on file   Number of children: Not on file   Years of education: Not on file   Highest education level: Not on file  Occupational History   Not on file  Tobacco Use   Smoking status: Never   Smokeless tobacco: Never  Vaping Use   Vaping status: Never Used  Substance and Sexual  Activity   Alcohol use: Yes    Comment: rare   Drug use: No   Sexual activity: Not on file  Other Topics Concern   Not on file  Social History Narrative   Not on file   Social Drivers of Health   Financial Resource Strain: Low Risk  (12/03/2022)   Received from [facility]   Overall Financial Resource Strain (CARDIA)    Difficulty of Paying Living Expenses: Not hard at all  Food Insecurity: Low Risk  (07/23/2023)   Received from [facility]   Hunger Vital Sign    Worried About Running Out of Food in the Last Year: Never true    Ran Out of Food in the Last Year: Never true  Transportation Needs: No Transportation Needs (07/23/2023)   Received from [facility]    In the past 12 months, has lack of reliable transportation kept you from medical appointments, meetings, work or from getting things needed for daily living? : No  Physical Activity: Unknown (12/03/2022)   Received from [facility]   Exercise Vital Sign    Days of Exercise per Week: Patient declined    Minutes of Exercise per Session: 10 min  Stress: No Stress Concern  Present (04/02/2023)   Received from [facility] Occupational Health - Occupational Stress Questionnaire    Feeling of Stress : Not at all  Social Connections: Socially Integrated (12/03/2022)   Received from [facility]   Social Network    How would you rate your social network (family, work, friends)?: Good participation with social networks       Review of Systems  Vital Signs:   Advance Care Plan: no documents on file  Physical Exam  Imaging: No results found.  Labs:  CBC: Recent Labs    07/21/23 1351 11/24/23 1113  WBC 10.2 19.2*  HGB 14.5 15.7  HCT 42.9 48.9  PLT 215 255    COAGS: Recent Labs    11/24/23 1113  INR 1.0    BMP: Recent Labs    07/21/23 1351 11/24/23 1113  NA 136 138  K 3.6 4.4  CL 105 105  CO2 20* 21*  GLUCOSE 167* 112*  BUN 18 17  CALCIUM  9.2 9.5  CREATININE 1.33* 0.98  GFRNONAA >60 >60    LIVER FUNCTION TESTS: Recent Labs    07/21/23 1416  BILITOT 0.6  AST 25  ALT 31  ALKPHOS 79  PROT 6.6  ALBUMIN 3.9    TUMOR MARKERS: No results for input(s): "AFPTM", "CEA", "CA199", "CHROMGRNA" in the last 8760 hours.  Assessment and Plan: 63 y.o. male with PMH sig for anemia, arthritis, asthma, CAD with prior MI/stenting 2019, DM, GERD, prior GI bleed, gout, nephrolithiasis, HTN, prior cervical/lumbar fusion surgery,  sleep apnea and recently noted incidental 1.2 cm right renal mass suspicious for RCC. He underwent consultation with Dr. Darylene Epley on 09/23/23 to discuss treatment options for his right renal mass and was deemed an appropriate candidate for image guided microwave ablation/possible biopsy. He presents today for the procedure.Risks and benefits of procedure was discussed with the patient and/or patient's family including, but not limited to bleeding, infection, anesthesia related complications, damage to adjacent structures .  All of the questions were answered and there is agreement to  proceed.  Consent signed and in chart.    Thank you for allowing our service to participate in LEVESTER WALDRIDGE 's care.  Electronically Signed: D. Honore Lux, PA-C  12/01/2023, 4:42 PM      I spent a total of    25 Minutes in face to face in clinical consultation, greater than 50% of which was counseling/coordinating care for image guided microwave ablation and possible biopsy of right renal mass

## 2023-12-02 ENCOUNTER — Ambulatory Visit (HOSPITAL_COMMUNITY): Payer: Self-pay | Admitting: Physician Assistant

## 2023-12-02 ENCOUNTER — Ambulatory Visit (HOSPITAL_COMMUNITY): Payer: Self-pay

## 2023-12-02 ENCOUNTER — Ambulatory Visit (HOSPITAL_COMMUNITY)
Admission: RE | Admit: 2023-12-02 | Discharge: 2023-12-02 | Disposition: A | Discharge status: Home / Self Care | Source: Ambulatory Visit | Attending: Interventional Radiology | Admitting: Interventional Radiology

## 2023-12-02 ENCOUNTER — Encounter (HOSPITAL_COMMUNITY)
Admission: RE | Disposition: A | Discharge status: Home / Self Care | Payer: Self-pay | Source: Ambulatory Visit | Attending: Interventional Radiology

## 2023-12-02 ENCOUNTER — Ambulatory Visit (HOSPITAL_COMMUNITY)

## 2023-12-02 ENCOUNTER — Encounter (HOSPITAL_COMMUNITY): Payer: Self-pay | Admitting: Interventional Radiology

## 2023-12-02 DIAGNOSIS — N2889 Other specified disorders of kidney and ureter: Secondary | ICD-10-CM | POA: Diagnosis not present

## 2023-12-02 DIAGNOSIS — E119 Type 2 diabetes mellitus without complications: Secondary | ICD-10-CM | POA: Diagnosis not present

## 2023-12-02 DIAGNOSIS — G4730 Sleep apnea, unspecified: Secondary | ICD-10-CM | POA: Diagnosis not present

## 2023-12-02 DIAGNOSIS — Z01818 Encounter for other preprocedural examination: Secondary | ICD-10-CM

## 2023-12-02 DIAGNOSIS — I10 Essential (primary) hypertension: Secondary | ICD-10-CM | POA: Diagnosis not present

## 2023-12-02 DIAGNOSIS — Z7984 Long term (current) use of oral hypoglycemic drugs: Secondary | ICD-10-CM | POA: Insufficient documentation

## 2023-12-02 DIAGNOSIS — I2510 Atherosclerotic heart disease of native coronary artery without angina pectoris: Secondary | ICD-10-CM | POA: Insufficient documentation

## 2023-12-02 DIAGNOSIS — Z955 Presence of coronary angioplasty implant and graft: Secondary | ICD-10-CM | POA: Insufficient documentation

## 2023-12-02 DIAGNOSIS — C641 Malignant neoplasm of right kidney, except renal pelvis: Secondary | ICD-10-CM | POA: Insufficient documentation

## 2023-12-02 DIAGNOSIS — I252 Old myocardial infarction: Secondary | ICD-10-CM | POA: Diagnosis not present

## 2023-12-02 HISTORY — PX: RADIOLOGY WITH ANESTHESIA: SHX6223

## 2023-12-02 LAB — CBC WITH DIFFERENTIAL/PLATELET
Abs Immature Granulocytes: 0.14 10*3/uL — ABNORMAL HIGH (ref 0.00–0.07)
Basophils Absolute: 0.1 10*3/uL (ref 0.0–0.1)
Basophils Relative: 1 %
Eosinophils Absolute: 0.1 10*3/uL (ref 0.0–0.5)
Eosinophils Relative: 1 %
HCT: 48.1 % (ref 39.0–52.0)
Hemoglobin: 15.5 g/dL (ref 13.0–17.0)
Immature Granulocytes: 1 %
Lymphocytes Relative: 10 %
Lymphs Abs: 1 10*3/uL (ref 0.7–4.0)
MCH: 30.2 pg (ref 26.0–34.0)
MCHC: 32.2 g/dL (ref 30.0–36.0)
MCV: 93.8 fL (ref 80.0–100.0)
Monocytes Absolute: 1.6 10*3/uL — ABNORMAL HIGH (ref 0.1–1.0)
Monocytes Relative: 15 %
Neutro Abs: 7.3 10*3/uL (ref 1.7–7.7)
Neutrophils Relative %: 72 %
Platelets: 186 10*3/uL (ref 150–400)
RBC: 5.13 MIL/uL (ref 4.22–5.81)
RDW: 13.8 % (ref 11.5–15.5)
WBC: 10.2 10*3/uL (ref 4.0–10.5)
nRBC: 0 % (ref 0.0–0.2)

## 2023-12-02 LAB — HEPATIC FUNCTION PANEL
ALT: 35 U/L (ref 0–44)
AST: 27 U/L (ref 15–41)
Albumin: 4 g/dL (ref 3.5–5.0)
Alkaline Phosphatase: 79 U/L (ref 38–126)
Bilirubin, Direct: 0.2 mg/dL (ref 0.0–0.2)
Indirect Bilirubin: 0.6 mg/dL (ref 0.3–0.9)
Total Bilirubin: 0.8 mg/dL (ref 0.0–1.2)
Total Protein: 6.9 g/dL (ref 6.5–8.1)

## 2023-12-02 LAB — ABO/RH: ABO/RH(D): O POS

## 2023-12-02 LAB — GLUCOSE, CAPILLARY
Glucose-Capillary: 131 mg/dL — ABNORMAL HIGH (ref 70–99)
Glucose-Capillary: 168 mg/dL — ABNORMAL HIGH (ref 70–99)

## 2023-12-02 LAB — TYPE AND SCREEN
ABO/RH(D): O POS
Antibody Screen: NEGATIVE

## 2023-12-02 SURGERY — CT WITH ANESTHESIA
Anesthesia: General | Laterality: Right

## 2023-12-02 MED ORDER — ORAL CARE MOUTH RINSE: 15.0000 mL | Freq: Once | OROMUCOSAL | Status: AC

## 2023-12-02 MED ORDER — FENTANYL CITRATE PF 50 MCG/ML IJ SOSY
PREFILLED_SYRINGE | INTRAMUSCULAR | Status: AC
  Filled 2023-12-02: qty 1

## 2023-12-02 MED ORDER — LIDOCAINE 2% (20 MG/ML) 5 ML SYRINGE
INTRAMUSCULAR | Status: DC | PRN
Start: 2023-12-02 — End: 2023-12-02
  Administered 2023-12-02: 100 mg via INTRAVENOUS

## 2023-12-02 MED ORDER — OXYCODONE HCL 5 MG/5ML PO SOLN: 5.0000 mg | Freq: Once | ORAL | Status: AC | PRN

## 2023-12-02 MED ORDER — HYDROMORPHONE HCL 1 MG/ML IJ SOLN
0.2500 mg | INTRAMUSCULAR | Status: DC | PRN
  Administered 2023-12-02 (×3): 0.5 mg via INTRAVENOUS

## 2023-12-02 MED ORDER — ROCURONIUM BROMIDE 10 MG/ML (PF) SYRINGE
PREFILLED_SYRINGE | INTRAVENOUS | Status: DC | PRN
  Administered 2023-12-02: 10 mg via INTRAVENOUS
  Administered 2023-12-02: 50 mg via INTRAVENOUS
  Administered 2023-12-02: 10 mg via INTRAVENOUS

## 2023-12-02 MED ORDER — FENTANYL CITRATE (PF) 100 MCG/2ML IJ SOLN
INTRAMUSCULAR | Status: AC
  Filled 2023-12-02: qty 2

## 2023-12-02 MED ORDER — CEFTRIAXONE SODIUM 2 G IJ SOLR
2.0000 g | Freq: Once | INTRAMUSCULAR | Status: AC
  Administered 2023-12-02: 2 g via INTRAVENOUS
  Filled 2023-12-02: qty 20

## 2023-12-02 MED ORDER — LACTATED RINGERS IV SOLN: INTRAVENOUS | Status: DC

## 2023-12-02 MED ORDER — OXYCODONE HCL 5 MG PO TABS
ORAL_TABLET | ORAL | Status: AC
  Filled 2023-12-02: qty 1

## 2023-12-02 MED ORDER — BUPIVACAINE HCL (PF) 0.5 % IJ SOLN
INTRAMUSCULAR | Status: AC
  Filled 2023-12-02: qty 30

## 2023-12-02 MED ORDER — PHENYLEPHRINE HCL-NACL 20-0.9 MG/250ML-% IV SOLN
INTRAVENOUS | Status: DC | PRN
  Administered 2023-12-02: 60 ug/min via INTRAVENOUS

## 2023-12-02 MED ORDER — FENTANYL CITRATE (PF) 100 MCG/2ML IJ SOLN
INTRAMUSCULAR | Status: DC | PRN
  Administered 2023-12-02: 50 ug via INTRAVENOUS

## 2023-12-02 MED ORDER — FENTANYL CITRATE PF 50 MCG/ML IJ SOSY
25.0000 ug | PREFILLED_SYRINGE | INTRAMUSCULAR | Status: DC | PRN
  Administered 2023-12-02 (×3): 50 ug via INTRAVENOUS

## 2023-12-02 MED ORDER — PROPOFOL 10 MG/ML IV BOLUS
INTRAVENOUS | Status: DC | PRN
  Administered 2023-12-02: 100 mg via INTRAVENOUS

## 2023-12-02 MED ORDER — DEXAMETHASONE SODIUM PHOSPHATE 10 MG/ML IJ SOLN
INTRAMUSCULAR | Status: DC | PRN
Start: 2023-12-02 — End: 2023-12-02
  Administered 2023-12-02: 10 mg via INTRAVENOUS

## 2023-12-02 MED ORDER — ONDANSETRON HCL 4 MG/2ML IJ SOLN
INTRAMUSCULAR | Status: DC | PRN
  Administered 2023-12-02: 4 mg via INTRAVENOUS

## 2023-12-02 MED ORDER — HYDROMORPHONE HCL 1 MG/ML IJ SOLN
INTRAMUSCULAR | Status: AC
  Filled 2023-12-02: qty 1

## 2023-12-02 MED ORDER — OXYCODONE HCL 5 MG PO TABS
5.0000 mg | ORAL_TABLET | Freq: Once | ORAL | Status: AC | PRN
  Administered 2023-12-02: 5 mg via ORAL

## 2023-12-02 MED ORDER — AMISULPRIDE (ANTIEMETIC) 5 MG/2ML IV SOLN: 10.0000 mg | Freq: Once | INTRAVENOUS | Status: DC | PRN

## 2023-12-02 MED ORDER — PHENYLEPHRINE 80 MCG/ML (10ML) SYRINGE FOR IV PUSH (FOR BLOOD PRESSURE SUPPORT)
PREFILLED_SYRINGE | INTRAVENOUS | Status: DC | PRN
  Administered 2023-12-02: 160 ug via INTRAVENOUS
  Administered 2023-12-02 (×3): 80 ug via INTRAVENOUS
  Administered 2023-12-02: 160 ug via INTRAVENOUS
  Administered 2023-12-02: 80 ug via INTRAVENOUS

## 2023-12-02 MED ORDER — SUGAMMADEX SODIUM 200 MG/2ML IV SOLN
INTRAVENOUS | Status: DC | PRN
  Administered 2023-12-02: 200 mg via INTRAVENOUS

## 2023-12-02 MED ORDER — SODIUM CHLORIDE 0.9 % IV SOLN: INTRAVENOUS | Status: DC

## 2023-12-02 MED ORDER — PHENYLEPHRINE HCL-NACL 20-0.9 MG/250ML-% IV SOLN
INTRAVENOUS | Status: AC
  Filled 2023-12-02: qty 250

## 2023-12-02 MED ORDER — OXYCODONE HCL 5 MG PO TABS: 10.0000 mg | ORAL_TABLET | ORAL | Status: DC | PRN

## 2023-12-02 MED ORDER — SODIUM CHLORIDE (PF) 0.9 % IJ SOLN
INTRAMUSCULAR | Status: AC
  Filled 2023-12-02: qty 50

## 2023-12-02 MED ORDER — CHLORHEXIDINE GLUCONATE 0.12 % MT SOLN
15.0000 mL | Freq: Once | OROMUCOSAL | Status: AC
  Administered 2023-12-02: 15 mL via OROMUCOSAL

## 2023-12-02 MED ORDER — EPHEDRINE SULFATE-NACL 50-0.9 MG/10ML-% IV SOSY
PREFILLED_SYRINGE | INTRAVENOUS | Status: DC | PRN
  Administered 2023-12-02: 10 mg via INTRAVENOUS

## 2023-12-02 MED ORDER — INSULIN ASPART 100 UNIT/ML IJ SOLN: 0.0000 [IU] | INTRAMUSCULAR | Status: DC | PRN

## 2023-12-02 NOTE — Procedures (Signed)
 Vascular and Interventional Radiology Procedure Note  Patient: Hunter Krause DOB: 06/26/1961 Medical Record Number: [id number] Note Date/Time: 12/02/23 9:11 AM   Performing Physician: Art Largo, MD Assistant(s): None  Diagnosis: R renal mass. Q RCC   Procedure:  RIGHT RENAL MASS PERCUTANEOUS ABLATION and BIOPSY  Anesthesia: General Anesthesia Complications: None Estimated Blood Loss: Minimal Specimens: Sent for Pathology  Findings:  Successful CT-guided biopsy and MWA of a 1 cm R renal mass. A total of 3 samples were obtained. Hemostasis of the tract was achieved using Manual Pressure.  Plan: Bed rest for 3 hours.  See detailed procedure note with images in PACS. The patient tolerated the procedure well without incident or complication and was returned to PACU in stable condition.    Art Largo, MD [facility]   Pager. [phone] Clinic. [phone]

## 2023-12-02 NOTE — Progress Notes (Signed)
 Patient ID: Hunter Krause, male   DOB: 23-Sep-1960, 63 y.o.   MRN: [id number] Pt s/p image guided right renal mass bx/MWA earlier today; pt seen in recovery unit; c/o some mild-mod rt flank discomfort but also has known hx chronic back pain; denies fever,N/V or resp issues; BP 146/87  HR 87 afebrile, O2 sats 97% RA; pt has eaten and voided yellow urine; puncture sites rt flank clean and dry, no visible hematoma; awake/alert; chest- CTA bilat; heart- RRR; abd-soft,+BS, no ant tenderness noted; no LE edema; above d/w Dr.Mugweru and decision made to dc pt home; IR team will arrange clinic f/u in 1 month

## 2023-12-02 NOTE — Anesthesia Procedure Notes (Signed)
 Procedure Name: Intubation Date/Time: 12/02/2023 9:12 AM  Performed by: Ezzie Holstein, CRNAPre-anesthesia Checklist: Patient identified, Emergency Drugs available, Suction available and Patient being monitored Patient Re-evaluated:Patient Re-evaluated prior to induction Oxygen Delivery Method: Circle System Utilized Preoxygenation: Pre-oxygenation with 100% oxygen Induction Type: IV induction Ventilation: Mask ventilation without difficulty Laryngoscope Size: Mac and 4 Grade View: Grade I Tube type: Oral Tube size: 7.0 mm Number of attempts: 1 Airway Equipment and Method: Stylet and Oral airway Placement Confirmation: ETT inserted through vocal cords under direct vision, positive ETCO2 and breath sounds checked- equal and bilateral Secured at: 22 cm Tube secured with: Tape Dental Injury: Teeth and Oropharynx as per pre-operative assessment

## 2023-12-02 NOTE — Transfer of Care (Signed)
 Immediate Anesthesia Transfer of Care Note  Patient: Hunter Krause  Procedure(s) Performed: CT WITH ANESTHESIA (Right)  Patient Location: PACU  Anesthesia Type:General  Level of Consciousness: awake, alert , oriented, and patient cooperative  Airway & Oxygen Therapy: Patient Spontanous Breathing  Post-op Assessment: Report given to RN and Post -op Vital signs reviewed and stable  Post vital signs: Reviewed and stable  Last Vitals:  Vitals Value Taken Time  BP 162/107 12/02/23 1115  Temp 36.4 C 12/02/23 1115  Pulse 74 12/02/23 1119  Resp 18 12/02/23 1119  SpO2 100 % 12/02/23 1119  Vitals shown include unfiled device data.  Last Pain: There were no vitals filed for this visit.       Complications: No notable events documented.

## 2023-12-02 NOTE — Anesthesia Preprocedure Evaluation (Addendum)
 Anesthesia Evaluation  Patient identified by MRN, date of birth, ID band Patient awake    Reviewed: Allergy & Precautions, NPO status , Patient's Chart, lab work & pertinent test results, reviewed documented beta blocker date and time   Airway Mallampati: II  TM Distance: >3 FB Neck ROM: Full    Dental no notable dental hx. (+) Teeth Intact, Dental Advisory Given   Pulmonary asthma , sleep apnea (no CPAP)    Pulmonary exam normal breath sounds clear to auscultation       Cardiovascular hypertension, Pt. on home beta blockers and Pt. on medications + CAD, + Past MI and + Cardiac Stents  Normal cardiovascular exam Rhythm:Regular Rate:Normal  Stress test 08/2023 unremarkable   Neuro/Psych negative neurological ROS  negative psych ROS   GI/Hepatic Neg liver ROS,GERD  ,,  Endo/Other  diabetes, Type 2, Oral Hypoglycemic Agents    Renal/GU negative Renal ROS  negative genitourinary   Musculoskeletal  (+) Arthritis ,    Abdominal   Peds  Hematology negative hematology ROS (+)   Anesthesia Other Findings   Reproductive/Obstetrics                             Anesthesia Physical Anesthesia Plan  ASA: 3  Anesthesia Plan: General   Post-op Pain Management: Minimal or no pain anticipated   Induction: Intravenous  PONV Risk Score and Plan: 2 and Midazolam , Dexamethasone  and Ondansetron   Airway Management Planned: Oral ETT  Additional Equipment:   Intra-op Plan:   Post-operative Plan: Extubation in OR  Informed Consent: I have reviewed the patients History and Physical, chart, labs and discussed the procedure including the risks, benefits and alternatives for the proposed anesthesia with the patient or authorized representative who has indicated his/her understanding and acceptance.     Dental advisory given  Plan Discussed with: CRNA  Anesthesia Plan Comments:        Anesthesia  Quick Evaluation

## 2023-12-02 NOTE — Sedation Documentation (Signed)
 Anesthesia in to sedate and monitor.

## 2023-12-02 NOTE — Anesthesia Postprocedure Evaluation (Signed)
 Anesthesia Post Note  Patient: Hunter Krause  Procedure(s) Performed: CT WITH ANESTHESIA (Right)     Patient location during evaluation: PACU Anesthesia Type: General Level of consciousness: awake and alert Pain management: pain level controlled Vital Signs Assessment: post-procedure vital signs reviewed and stable Respiratory status: spontaneous breathing, nonlabored ventilation, respiratory function stable and patient connected to nasal cannula oxygen Cardiovascular status: blood pressure returned to baseline and stable Postop Assessment: no apparent nausea or vomiting Anesthetic complications: no  No notable events documented.  Last Vitals:  Vitals:   12/02/23 1315 12/02/23 1322  BP: (!) 159/93 (!) 163/90  Pulse: 91 85  Resp: 20 18  Temp:  36.6 C  SpO2: 100% 100%    Last Pain:  Vitals:   12/02/23 1322  PainSc: 3                  Ralphie Lovelady L Kallee Nam

## 2023-12-03 ENCOUNTER — Encounter (HOSPITAL_COMMUNITY): Payer: Self-pay | Admitting: Radiology

## 2023-12-04 LAB — SURGICAL PATHOLOGY

## 2023-12-22 ENCOUNTER — Ambulatory Visit
Admission: RE | Admit: 2023-12-22 | Discharge: 2023-12-22 | Disposition: A | Discharge status: Home / Self Care | Source: Ambulatory Visit | Attending: Radiology | Admitting: Radiology

## 2023-12-22 DIAGNOSIS — N2889 Other specified disorders of kidney and ureter: Secondary | ICD-10-CM

## 2023-12-22 HISTORY — PX: IR RADIOLOGIST EVAL & MGMT: IMG5224

## 2023-12-22 NOTE — Progress Notes (Signed)
 Reason for visit: R renal mass evaluation    Care Team (s) PCP: [name], MD Urology: [name], MD Cardiology: [name], PA-C  Virtual Visit via Video Conferencing  I connected with Hunter Krause on 12/22/23 by video-telephonic conference and verified that I am speaking with the correct person using two identifiers. I discussed the limitations, risks, security and privacy concerns of performing an evaluation and management service by tele-visit and the availability of in-person appointments.   History of Present Illness:   Hunter Krause is a 63 y.o. male w PMHx of T2DM on metformin, HTN, CAD s/p PCI and DES (2019) on ASA 81, asthma (severe), back pain s/p L2-3 fusion (04/02/23) and renal stones s/p lithotripsies. Pt is joined by their spouse, Hunter Krause, who report that they were sent from their cardiology clinic to ER in 07/2023 w severe RLQ pain. Investigations including CT AP (07/23/2023) revealed a 1 cm incidental R renal lesion.   He was seen in urological followup, had multiphase MR abdomen (07/24/23) confirming a 1.2 cm enhancing R renal mass suspicious for RCC. He was referred for minimally invasive treatment given his comorbidities.   He underwent successful ablation by me on 12/02/23 and was discharged without event same day after an extended observation. He reports some intermittent 'post ablation' discomfort at his side that he noticed but not taken anything for. He otherwise denies any discomfort, hematuria or other localizing symptoms.   Review of Systems: A 12-point ROS discussed, and pertinent positives are indicated in the HPI above.  All other systems are otherwise negative.   Past Medical History:  Diagnosis Date   Anemia    Arthritis    Asthma 1994   allergy induced asthma   Cancer (HCC)    kidney lesion   Coronary artery disease    Diabetes mellitus without complication (HCC) type 2    GERD (gastroesophageal reflux  disease)    GI bleed 12/01/2021   [facility]   GI bleed 12/01/2021   [facility]mit [facility]   Gout    no recent flares   History of kidney stones    Hypertension    treated with hydrocholothiazide   Myocardial infarction (HCC) 2019   Pneumonia    Sleep apnea    treated with surgery. No problems now.    Past Surgical History:  Procedure Laterality Date   ARTHROSCOPY KNEE W/ DRILLING  2004   right knee   cervical neck fusion     C 3 to C 5 has plates and screws   CHOLECYSTECTOMY     CORONARY ANGIOPLASTY  2019   stent x 1   CORONARY STENT INTERVENTION     CYSTOSCOPY/URETEROSCOPY/HOLMIUM LASER/STENT PLACEMENT Left 09/10/2020   Procedure: CYSTOSCOPY LEFT URETEROSCOPY/HOLMIUM LASER/STENT PLACEMENT;  Surgeon: Carolee Sherwood JONETTA DOUGLAS, MD;  Location: [facility];  Service: Urology;  Laterality: Left;   CYSTOSCOPY/URETEROSCOPY/HOLMIUM LASER/STENT PLACEMENT Bilateral 05/28/2022   Procedure: CYSTOSCOPY/ RETROGRADE/URETEROSCOPY/HOLMIUM LASER/STENT PLACEMENT;  Surgeon: [name], MD;  Location[facility];  Service: Urology;  Laterality: Bilateral;   EXTRACORPOREAL SHOCK WAVE LITHOTRIPSY Left 09/03/2020   Procedure: LEFT EXTRACORPOREAL SHOCK WAVE LITHOTRIPSY (ESWL);  Surgeon: Carolee Sherwood JONETTA DOUGLAS, MD;  Location: [facility];  Service: Urology;  Laterality: Left;   IR RADIOLOGIST EVAL & MGMT  09/23/2023   LITHOTRIPSY  2001   lower back  2020   fusion L 4 to L 6   NASAL SEPTUM SURGERY  2005  palate remove for osa   RADIOLOGY WITH ANESTHESIA Right 12/02/2023   Procedure: CT WITH ANESTHESIA;  Surgeon: Radiologist, Medication, MD;  Location: [facility];  Service: Radiology;  Laterality: Right;    Allergies: Fluogen [influenza virus vaccine] and Shrimp (diagnostic)  Medications: Prior to Admission medications   Medication Sig Start Date End Date Taking? Authorizing Provider  albuterol (PROVENTIL HFA;VENTOLIN HFA) 108 (90 BASE) MCG/ACT inhaler Inhale 2 puffs  into the lungs every 6 (six) hours as needed for wheezing or shortness of breath.    [provider]  aspirin  EC 81 MG tablet Take 81 mg by mouth every evening.    [provider]  atenolol  (TENORMIN ) 25 MG tablet Take 25 mg by mouth at bedtime.    [provider]  atorvastatin  (LIPITOR) 40 MG tablet Take 40 mg by mouth every evening.    [provider]  calcium  carbonate (TUMS - DOSED IN MG ELEMENTAL CALCIUM ) 500 MG chewable tablet Chew 1 tablet by mouth 3 (three) times daily as needed for indigestion or heartburn.    [provider]  Cholecalciferol (VITAMIN D3 PO) Take 1,000 Units by mouth in the morning.    [provider]  clotrimazole (MYCELEX) 10 MG troche Take 10 mg by mouth 5 (five) times daily as needed (thrush).    [provider]  cyanocobalamin (VITAMIN B12) 1000 MCG tablet Take 1,000 mcg by mouth in the morning.    [provider]  diclofenac Sodium (VOLTAREN) 1 % GEL Apply 2 g topically daily as needed (pain).    [provider]  EPINEPHrine 0.3 mg/0.3 mL IJ SOAJ injection Inject 0.3 mg into the muscle as needed for anaphylaxis.    [provider]  HYDROcodone-acetaminophen  (NORCO/VICODIN) 5-325 MG tablet Take 1 tablet by mouth every 6 (six) hours as needed.    [provider]  lisinopril (ZESTRIL) 5 MG tablet Take 5 mg by mouth every evening.    [provider]  Melatonin 10 MG TABS Take 10 mg by mouth at bedtime.    [provider]  metFORMIN (GLUCOPHAGE-XR) 500 MG 24 hr tablet Take 1,000 mg by mouth in the morning and at bedtime.    [provider]  nitroGLYCERIN  (NITROSTAT ) 0.4 MG SL tablet Place 0.4 mg under the tongue every 5 (five) minutes as needed for chest pain.    [provider]  olopatadine (PATANOL) 0.1 % ophthalmic solution Place 1 drop into both eyes 2 (two) times daily as needed for allergies. 05/02/22   [provider]   pantoprazole  (PROTONIX ) 40 MG tablet Take 40 mg by mouth daily as needed (indigestion/heartburn.).    [provider]  polyethylene glycol (MIRALAX / GLYCOLAX) 17 g packet Take 17 g by mouth daily as needed for moderate constipation.    [provider]  QVAR REDIHALER 80 MCG/ACT inhaler Inhale 2 puffs into the lungs 2 (two) times daily.    [provider]  sildenafil (VIAGRA) 50 MG tablet Take 50 mg by mouth daily as needed for erectile dysfunction.    [provider]  tamsulosin  (FLOMAX ) 0.4 MG CAPS capsule Take 0.4 mg by mouth at bedtime.    [provider]  tetrahydrozoline-zinc (VISINE-AC) 0.05-0.25 % ophthalmic solution 2 drops 3 (three) times daily as needed (irritated eyes.).    [provider]  Tezepelumab-ekko 210 MG/1. SOAJ Inject 210 mg into the skin every 28 (twenty-eight) days. 02/17/23   [provider]  Tiotropium Bromide Monohydrate (SPIRIVA RESPIMAT) 1.25  MCG/ACT AERS Inhale 2 each into the lungs in the morning.    [provider]  tiZANidine (ZANAFLEX) 4 MG tablet Take 4 mg by mouth at bedtime.    [provider]  zileuton (ZYFLO CR) 600 MG CR tablet Take 1,200 mg by mouth in the morning and at bedtime. 10/23/16   [provider]  zinc sulfate, 50mg  elemental zinc, 220 (50 Zn) MG capsule Take 220 mg by mouth in the morning.    [provider]     No family history on file.  Social History   Socioeconomic History   Marital status: Married    Spouse name: Not on file   Number of children: Not on file   Years of education: Not on file   Highest education level: Not on file  Occupational History   Not on file  Tobacco Use   Smoking status: Never   Smokeless tobacco: Never  Vaping Use   Vaping status: Never Used  Substance and Sexual Activity   Alcohol use: Yes    Comment: rare   Drug use: No   Sexual activity: Not on file  Other Topics Concern   Not on file  Social  History Narrative   Not on file   Social Drivers of Health   Financial Resource Strain: Low Risk  (12/03/2022)   Received [facility]   Overall Financial Resource Strain (CARDIA)    Difficulty of Paying Living Expenses: Not hard at all  Food Insecurity: Low Risk  (07/23/2023)   Received from [facility]   Hunger Vital Sign    Within the past 12 months, you worried that your food would run out before you got money to buy more: Never true    Within the past 12 months, the food you bought just didn't last and you didn't have money to get more. : Never true  Transportation Needs: No Transportation Needs (07/23/2023)   Received from [employer]    In the past 12 months, has lack of reliable transportation kept you from medical appointments, meetings, work or from getting things needed for daily living? : No  Physical Activity: Unknown (12/03/2022)   Received from [facility]   Exercise Vital Sign    On average, how many days per week do you engage in moderate to strenuous exercise (like a brisk walk)?: Hunter Krause declined    On average, how many minutes do you engage in exercise at this level?: 10 min  Stress: No Stress Concern Present (04/02/2023)   Received from [facility] - Occupational Stress Questionnaire    Feeling of Stress : Not at all  Social Connections: Socially Integrated (12/03/2022)   Received from [facility]   Social Network    How would you rate your social network (family, work, friends)?: Good participation with social networks     Vital Signs: There were no vitals taken for this visit.  Physical Exam Deferred secondary to virtual visit.  Imaging:    IR CT MWA, 12/02/23 IMPRESSION:  Successful CT-guided percutaneous biopsy and microwave thermal ablation of a 1.5 cm posterior midpole RIGHT renal mass.  No results found.  Labs:  CBC: Recent Labs    07/21/23 1351 11/24/23 1113  12/02/23 0650  WBC 10.2 19.2* 10.2  HGB 14.5 15.7 15.5  HCT 42.9 48.9 48.1  PLT 215 255 186    COAGS: Recent Labs    11/24/23 1113  INR 1.0  BMP: Recent Labs    07/21/23 1351 11/24/23 1113  NA 136 138  K 3.6 4.4  CL 105 105  CO2 20* 21*  GLUCOSE 167* 112*  BUN 18 17  CALCIUM  9.2 9.5  CREATININE 1.33* 0.98  GFRNONAA >60 >60   Pathology:  SURGICAL PATHOLOGY CASE: [id number] Hunter Krause: Ronald Till Surgical Pathology Report  Clinical History: Right renal mass, Q RCC (las)  FINAL MICROSCOPIC DIAGNOSIS:  A. RENAL MASS, RIGHT, BIOPSY: Clear cell renal cell carcinoma, nuclear grade 2.   Assessment and Plan:  63 y/o M w PMHx of T2DM on metformin, HTN, CAD s/p PCI and DES (2019) on ASA 81, asthma (severe), back pain s/p L2-3 fusion (04/02/23) and renal stones s/p lithotripsies. 1.2 cm enhancing R renal mass suspicious for RCC incidentally found on ER workup.   Pt now s/p R renal mass biopsy and microwave ablation on 12/02/23 Pathology positive for clear cell RCC   *Pt back to baseline. No post procedural concern. *3 mos follow up imaging w multiphasic MR post ablation (12/02/23), no sooner. *Will see him back, in virtual clinic, for imaging review in 3 mos. *Pt will also follow up with his Urologist.   Thank you for allowing us  to participate in the care of this Hunter Krause.  Electronically Signed:  Thom Hall, MD [facility]   Pager. [phone] Clinic. [phone]  As part of this video-telephonic encounter, no in-person exam was conducted.   The Hunter Krause was physically located in [place]  or a state in which I am permitted to provide care. The encounter was reasonable and appropriate under the circumstances given the Hunter Krause's presentation at the time.  I spent a total of 30 Minutes of non-face-to-face time in clinical consultation, greater than 50% of which was counseling/coordinating  care for Mr. MARLYN RABINE' evaluation for renal mass treatment.

## 2024-01-18 NOTE — Addendum Note (Signed)
 Encounter addended by: Hughes Simmonds, MD on: 01/18/2024 7:05 AM  Actions taken: Clinical Note Signed

## 2024-03-07 ENCOUNTER — Other Ambulatory Visit: Payer: Self-pay | Admitting: Interventional Radiology

## 2024-03-07 DIAGNOSIS — D49511 Neoplasm of unspecified behavior of right kidney: Secondary | ICD-10-CM

## 2024-03-15 ENCOUNTER — Telehealth

## 2024-03-15 ENCOUNTER — Ambulatory Visit
Admission: RE | Admit: 2024-03-15 | Discharge: 2024-03-15 | Disposition: A | Discharge status: Home / Self Care | Source: Ambulatory Visit | Attending: Interventional Radiology

## 2024-03-15 DIAGNOSIS — D49511 Neoplasm of unspecified behavior of right kidney: Secondary | ICD-10-CM

## 2024-03-15 MED ORDER — GADOPICLENOL 0.5 MMOL/ML IV SOLN
10.0000 mL | Freq: Once | INTRAVENOUS | Status: AC | PRN
  Administered 2024-03-15: 10 mL via INTRAVENOUS

## 2024-03-17 ENCOUNTER — Inpatient Hospital Stay
Admission: RE | Admit: 2024-03-17 | Discharge: 2024-03-17 | Discharge status: Home / Self Care | Source: Ambulatory Visit | Attending: Interventional Radiology

## 2024-03-17 ENCOUNTER — Telehealth

## 2024-03-17 DIAGNOSIS — D49511 Neoplasm of unspecified behavior of right kidney: Secondary | ICD-10-CM

## 2024-03-17 HISTORY — PX: IR RADIOLOGIST EVAL & MGMT: IMG5224

## 2024-03-17 NOTE — Progress Notes (Incomplete)
 Referring Physician(s): Alliene Klugh  Supervising Physician: {Supervising Physician:[id number]}  Chief Complaint: The patient is seen in follow up today s/p ***  History of present illness:  ***  f/U Dr Devere, 03/07/24  Past Medical History:  Diagnosis Date   Anemia    Arthritis    Asthma 1994   allergy induced asthma   Cancer (HCC)    kidney lesion   Coronary artery disease    Diabetes mellitus without complication (HCC) type 2    GERD (gastroesophageal reflux disease)    GI bleed 12/01/2021   admit [facility]   GI bleed 12/01/2021   admit [facility]   Gout    no recent flares   History of kidney stones    Hypertension    treated with hydrocholothiazide   Myocardial infarction (HCC) 2019   Pneumonia    Sleep apnea    treated with surgery. No problems now.    Past Surgical History:  Procedure Laterality Date   ARTHROSCOPY KNEE W/ DRILLING  2004   right knee   cervical neck fusion     C 3 to C 5 has plates and screws   CHOLECYSTECTOMY     CORONARY ANGIOPLASTY  2019   stent x 1   CORONARY STENT INTERVENTION     CYSTOSCOPY/URETEROSCOPY/HOLMIUM LASER/STENT PLACEMENT Left 09/10/2020   Procedure: CYSTOSCOPY LEFT URETEROSCOPY/HOLMIUM LASER/STENT PLACEMENT;  Surgeon: Carolee Sherwood JONETTA DOUGLAS, MD;  Location: [facility];  Service: Urology;  Laterality: Left;   CYSTOSCOPY/URETEROSCOPY/HOLMIUM LASER/STENT PLACEMENT Bilateral 05/28/2022   Procedure: CYSTOSCOPY/ RETROGRADE/URETEROSCOPY/HOLMIUM LASER/STENT PLACEMENT;  Surgeon: [name], MD;  Location[facility];  Service: Urology;  Laterality: Bilateral;   EXTRACORPOREAL SHOCK WAVE LITHOTRIPSY Left 09/03/2020   Procedure: LEFT EXTRACORPOREAL SHOCK WAVE LITHOTRIPSY (ESWL);  Surgeon: Carolee Sherwood JONETTA DOUGLAS, MD;  Location: [facility];  Service: Urology;  Laterality: Left;   IR RADIOLOGIST EVAL & MGMT  09/23/2023   IR RADIOLOGIST EVAL & MGMT  12/22/2023   LITHOTRIPSY  2001   lower back  2020    fusion L 4 to L 6   NASAL SEPTUM SURGERY  2005   palate remove for osa   RADIOLOGY WITH ANESTHESIA Right 12/02/2023   Procedure: CT WITH ANESTHESIA;  Surgeon: Radiologist, Medication, MD;  Location[facility];  Service: Radiology;  Laterality: Right;    Allergies: Fluogen [influenza virus vaccine] and Shrimp (diagnostic)  Medications: Prior to Admission medications   Medication Sig Start Date End Date Taking? Authorizing Provider  albuterol (PROVENTIL HFA;VENTOLIN HFA) 108 (90 BASE) MCG/ACT inhaler Inhale 2 puffs into the lungs every 6 (six) hours as needed for wheezing or shortness of breath.    [provider]  aspirin  EC 81 MG tablet Take 81 mg by mouth every evening.    [provider]  atenolol  (TENORMIN ) 25 MG tablet Take 25 mg by mouth at bedtime.    [provider]  atorvastatin  (LIPITOR) 40 MG tablet Take 40 mg by mouth every evening.    [provider]  calcium  carbonate (TUMS - DOSED IN MG ELEMENTAL CALCIUM ) 500 MG chewable tablet Chew 1 tablet by mouth 3 (three) times daily as needed for indigestion or heartburn.    [provider]  Cholecalciferol (VITAMIN D3 PO) Take 1,000 Units by mouth in the morning.    [provider]  clotrimazole (MYCELEX) 10 MG troche Take 10 mg by mouth 5 (five) times daily as needed (thrush).    [provider]  cyanocobalamin (VITAMIN  B12) 1000 MCG tablet Take 1,000 mcg by mouth in the morning.    [provider]  diclofenac Sodium (VOLTAREN) 1 % GEL Apply 2 g topically daily as needed (pain).    [provider]  EPINEPHrine 0.3 mg/0.3 mL IJ SOAJ injection Inject 0.3 mg into the muscle as needed for anaphylaxis.    [provider]  HYDROcodone-acetaminophen  (NORCO/VICODIN) 5-325 MG tablet Take 1 tablet by mouth every 6 (six) hours as needed.    [provider]  lisinopril (ZESTRIL) 5 MG tablet Take 5 mg by mouth every evening.    [provider]   Melatonin 10 MG TABS Take 10 mg by mouth at bedtime.    [provider]  metFORMIN (GLUCOPHAGE-XR) 500 MG 24 hr tablet Take 1,000 mg by mouth in the morning and at bedtime.    [provider]  nitroGLYCERIN  (NITROSTAT ) 0.4 MG SL tablet Place 0.4 mg under the tongue every 5 (five) minutes as needed for chest pain.    [provider]  olopatadine (PATANOL) 0.1 % ophthalmic solution Place 1 drop into both eyes 2 (two) times daily as needed for allergies. 05/02/22   [provider]  pantoprazole  (PROTONIX ) 40 MG tablet Take 40 mg by mouth daily as needed (indigestion/heartburn.).    [provider]  polyethylene glycol (MIRALAX / GLYCOLAX) 17 g packet Take 17 g by mouth daily as needed for moderate constipation.    [provider]  QVAR REDIHALER 80 MCG/ACT inhaler Inhale 2 puffs into the lungs 2 (two) times daily.    [provider]  sildenafil (VIAGRA) 50 MG tablet Take 50 mg by mouth daily as needed for erectile dysfunction.    [provider]  tamsulosin  (FLOMAX ) 0.4 MG CAPS capsule Take 0.4 mg by mouth at bedtime.    [provider]  tetrahydrozoline-zinc (VISINE-AC) 0.05-0.25 % ophthalmic solution 2 drops 3 (three) times daily as needed (irritated eyes.).    [provider]  Tezepelumab-ekko 210 MG/1. SOAJ Inject 210 mg into the skin every 28 (twenty-eight) days. 02/17/23   [provider]  Tiotropium Bromide Monohydrate (SPIRIVA RESPIMAT) 1.25 MCG/ACT AERS Inhale 2 each into the lungs in the morning.    [provider]  tiZANidine (ZANAFLEX) 4 MG tablet Take 4 mg by mouth at bedtime.    [provider]  zileuton (ZYFLO CR) 600 MG CR tablet Take 1,200 mg by mouth in the morning and at bedtime. 10/23/16   [provider]  zinc sulfate, 50mg  elemental zinc, 220 (50 Zn) MG capsule Take 220 mg by mouth in the morning.    [provider]     No family history on  file.  Social History   Socioeconomic History   Marital status: Married    Spouse name: Not on file   Number of children: Not on file   Years of education: Not on file   Highest education level: Not on file  Occupational History   Not on file  Tobacco Use   Smoking status: Never   Smokeless tobacco: Never  Vaping Use   Vaping status: Never Used  Substance and Sexual Activity   Alcohol use: Yes    Comment: rare   Drug use: No   Sexual activity: Not on file  Other Topics Concern   Not on file  Social History Narrative   Not on file   Social Drivers of Health   Financial Resource Strain: Low Risk  (01/07/2024)   Received  from [employer]   Overall Financial Resource Strain (CARDIA)    Difficulty of Paying Living Expenses: Not hard at all  Food Insecurity: No Food Insecurity (01/07/2024)   Received from [facility]   Hunger Vital Sign    Within the past 12 months, you worried that your food would run out before you got the money to buy more.: Never true    Within the past 12 months, the food you bought just didn't last and you didn't have money to get more.: Never true  Transportation Needs: No Transportation Needs (01/07/2024)   Received from [facility] - Transportation    Lack of Transportation (Medical): No    Lack of Transportation (Non-Medical): No  Physical Activity: Insufficiently Active (01/07/2024)   Received from [facility]   Exercise Vital Sign    On average, how many days per week do you engage in moderate to strenuous exercise (like a brisk walk)?: 2 days    On average, how many minutes do you engage in exercise at this level?: 20 min  Stress: No Stress Concern Present (01/07/2024)   Received from [facility] of Occupational Health - Occupational Stress Questionnaire    Feeling of Stress : Not at all  Social Connections: Socially Integrated (01/07/2024)   Received from [facility]   Social Network    How would you  rate your social network (family, work, friends)?: Good participation with social networks     Vital Signs: There were no vitals taken for this visit.  Physical Exam  Imaging:    MR Abd W/WO, 9/16/258 IMPRESSION:  1. Heterogeneously enhancing lateral right interpolar renal lesion measures 1.2 x 1.0 cm, suspicious for primary renal neoplasm.  2. No evidence of metastatic disease in the abdomen.  MR ABDOMEN WWO CONTRAST Result Date: 03/15/2024 CLINICAL DATA:  History provided by technologist Neoplasm in right kidney. Prior surgery. EXAM: MRI ABDOMEN WITHOUT AND WITH CONTRAST TECHNIQUE: Multiplanar multisequence MR imaging of the abdomen was performed both before and after the administration of intravenous contrast. CONTRAST:  10 mL of Vueway . COMPARISON:  MRI abdomen from 07/24/2023. FINDINGS: Lower chest: Unremarkable MR appearance to the lung bases. No pleural effusion. No pericardial effusion. Normal heart size. Hepatobiliary: The liver is normal in size. Noncirrhotic configuration. No focal lesion. There is mild diffuse hepatic steatosis. No intrahepatic or extrahepatic bile duct dilatation. No choledocholithiasis. Status post cholecystectomy. Pancreas: No mass, inflammatory changes or other parenchymal abnormality identified. No main pancreatic duct dilation. Spleen: Normal in size. There is a stable subcapsular, multilobulated, 1.7 x 1.8 cm T2 hyperintense nonenhancing lesion arising from the superolateral aspect, anteriorly, favored to represent a cyst. Adrenals/Urinary Tract: Unremarkable adrenal glands. No hydroureteronephrosis. There are multiple subcentimeter sized cysts in the left kidney. There is posttreatment cavity in the right kidney interpolar region laterally with associated well-circumscribed macroscopic fat attenuation area with thin T2 hypointense enhancing wall. Findings favor satisfactory post cryoablation changes. No residual or recurrent tumor seen. Stomach/Bowel:  Visualized portions within the abdomen are unremarkable. No disproportionate dilation of bowel loops. Vascular/Lymphatic: No pathologically enlarged lymph nodes identified. No abdominal aortic aneurysm demonstrated. No ascites. Other:  None. Musculoskeletal: No suspicious bone lesions identified. IMPRESSION: 1. Satisfactory post cryoablation changes in the right kidney interpolar region. No residual or recurrent tumor seen. 2. No metastatic disease identified within the abdomen. 3. Multiple other nonacute observations, as described above. Electronically Signed   By: Ree Molt M.D.   On: 03/15/2024 15:26  Labs:  CBC: Recent Labs    07/21/23 1351 11/24/23 1113 12/02/23 0650  WBC 10.2 19.2* 10.2  HGB 14.5 15.7 15.5  HCT 42.9 48.9 48.1  PLT 215 255 186    COAGS: Recent Labs    11/24/23 1113  INR 1.0    BMP: Recent Labs    07/21/23 1351 11/24/23 1113  NA 136 138  K 3.6 4.4  CL 105 105  CO2 20* 21*  GLUCOSE 167* 112*  BUN 18 17  CALCIUM  9.2 9.5  CREATININE 1.33* 0.98  GFRNONAA >60 >60    LIVER FUNCTION TESTS: Recent Labs    07/21/23 1416 12/02/23 0650  BILITOT 0.6 0.8  AST 25 27  ALT 31 35  ALKPHOS 79 79  PROT 6.6 6.9  ALBUMIN 3.9 4.0    Assessment and Plan:  ***  Electronically Signed: Thom Hall 03/17/2024, 8:44 AM   I spent a total of {Established Out-Pt:[id number]} in face to face in clinical consultation, greater than 50% of which was counseling/coordinating care for ***

## 2024-04-19 NOTE — Progress Notes (Signed)
 Reason for visit: R renal mass post treatment follow up    Care Team (s) PCP: [name], MD Urology: [name], MD Cardiology: [name], PA-C   Virtual Visit via Video Conferencing   I connected with Hunter Krause on 03/17/24 by video-telephonic conference and verified that I am speaking with the correct person using two identifiers. I discussed the limitations, risks, security and privacy concerns of performing an evaluation and management service by tele-visit and the availability of in-person appointments.   History of Present Illness:   Mr. Hunter Krause is a 63 y.o. male w PMHx of T2DM on metformin, HTN, CAD s/p PCI and DES (2019) on ASA 81, asthma (severe), back pain s/p L2-3 fusion (04/02/23) and renal stones s/p lithotripsies. Pt is joined by their spouse, Hunter Krause, who report that they were sent from their cardiology clinic to ER in 07/2023 w severe RLQ pain. Investigations including CT AP (07/23/2023) revealed a 1 cm incidental R renal lesion.   He was seen in urological followup, had multiphase MR abdomen (07/24/23) confirming a 1.2 cm enhancing R renal mass suspicious for RCC. He was referred for minimally invasive treatment given his comorbidities.   He underwent successful ablation by me on 12/02/23 and was discharged without event same day after an extended observation. He was last seen by me on 12/22/23 in immediate post procedural follow up and reported intermittent 'post ablation' discomfort, this has since resolved. He denies any discomfort, hematuria or other localizing symptoms.    Review of Systems: A 12-point ROS discussed, and pertinent positives are indicated in the HPI above.  All other systems are otherwise negative.  Past Medical History:  Diagnosis Date   Anemia    Arthritis    Asthma 1994   allergy induced asthma   Cancer (HCC)    kidney lesion   Coronary artery disease    Diabetes mellitus without complication (HCC)  type 2    GERD (gastroesophageal reflux disease)    GI bleed 12/01/2021   admit to [facility]   GI bleed 12/01/2021   admit to [facility]   Gout    no recent flares   History of kidney stones    Hypertension    treated with hydrocholothiazide   Myocardial infarction (HCC) 2019   Pneumonia    Sleep apnea    treated with surgery. No problems now.    Past Surgical History:  Procedure Laterality Date   ARTHROSCOPY KNEE W/ DRILLING  2004   right knee   cervical neck fusion     C 3 to C 5 has plates and screws   CHOLECYSTECTOMY     CORONARY ANGIOPLASTY  2019   stent x 1   CORONARY STENT INTERVENTION     CYSTOSCOPY/URETEROSCOPY/HOLMIUM LASER/STENT PLACEMENT Left 09/10/2020   Procedure: CYSTOSCOPY LEFT URETEROSCOPY/HOLMIUM LASER/STENT PLACEMENT;  Surgeon: Carolee Sherwood JONETTA DOUGLAS, MD;  Location: [facility];  Service: Urology;  Laterality: Left;   CYSTOSCOPY/URETEROSCOPY/HOLMIUM LASER/STENT PLACEMENT Bilateral 05/28/2022   Procedure: CYSTOSCOPY/ RETROGRADE/URETEROSCOPY/HOLMIUM LASER/STENT PLACEMENT;  Surgeon: [name], MD;  Location: [facility];  Service: Urology;  Laterality: Bilateral;   EXTRACORPOREAL SHOCK WAVE LITHOTRIPSY Left 09/03/2020   Procedure: LEFT EXTRACORPOREAL SHOCK WAVE LITHOTRIPSY (ESWL);  Surgeon: Carolee Sherwood JONETTA DOUGLAS, MD;  Location: [facility];  Service: Urology;  Laterality: Left;   IR RADIOLOGIST EVAL & MGMT  09/23/2023   IR RADIOLOGIST EVAL & MGMT  12/22/2023   IR RADIOLOGIST EVAL & MGMT  03/17/2024   LITHOTRIPSY  2001   lower back  2020   fusion L 4 to L 6   NASAL SEPTUM SURGERY  2005   palate remove for osa   RADIOLOGY WITH ANESTHESIA Right 12/02/2023   Procedure: CT WITH ANESTHESIA;  Surgeon: Radiologist, Medication, MD;  Location: [facility];  Service: Radiology;  Laterality: Right;    Allergies: Fluogen [influenza virus vaccine] and Shrimp (diagnostic)  Medications: Prior to Admission medications   Medication Sig Start Date  End Date Taking? Authorizing Provider  albuterol (PROVENTIL HFA;VENTOLIN HFA) 108 (90 BASE) MCG/ACT inhaler Inhale 2 puffs into the lungs every 6 (six) hours as needed for wheezing or shortness of breath.    [provider]  aspirin  EC 81 MG tablet Take 81 mg by mouth every evening.    [provider]  atenolol  (TENORMIN ) 25 MG tablet Take 25 mg by mouth at bedtime.    [provider]  atorvastatin  (LIPITOR) 40 MG tablet Take 40 mg by mouth every evening.    [provider]  calcium  carbonate (TUMS - DOSED IN MG ELEMENTAL CALCIUM ) 500 MG chewable tablet Chew 1 tablet by mouth 3 (three) times daily as needed for indigestion or heartburn.    [provider]  Cholecalciferol (VITAMIN D3 PO) Take 1,000 Units by mouth in the morning.    [provider]  clotrimazole (MYCELEX) 10 MG troche Take 10 mg by mouth 5 (five) times daily as needed (thrush).    [provider]  cyanocobalamin (VITAMIN B12) 1000 MCG tablet Take 1,000 mcg by mouth in the morning.    [provider]  diclofenac Sodium (VOLTAREN) 1 % GEL Apply 2 g topically daily as needed (pain).    [provider]  EPINEPHrine 0.3 mg/0.3 mL IJ SOAJ injection Inject 0.3 mg into the muscle as needed for anaphylaxis.    [provider]  HYDROcodone-acetaminophen  (NORCO/VICODIN) 5-325 MG tablet Take 1 tablet by mouth every 6 (six) hours as needed.    [provider]  lisinopril (ZESTRIL) 5 MG tablet Take 5 mg by mouth every evening.    [provider]  Melatonin 10 MG TABS Take 10 mg by mouth at bedtime.    [provider]  metFORMIN (GLUCOPHAGE-XR) 500 MG 24 hr tablet Take 1,000 mg by mouth in the morning and at bedtime.    [provider]  nitroGLYCERIN  (NITROSTAT ) 0.4 MG SL tablet Place 0.4 mg under the tongue every 5 (five) minutes as needed for chest pain.    [provider]  olopatadine (PATANOL) 0.1 % ophthalmic  solution Place 1 drop into both eyes 2 (two) times daily as needed for allergies. 05/02/22   [provider]  pantoprazole  (PROTONIX ) 40 MG tablet Take 40 mg by mouth daily as needed (indigestion/heartburn.).    [provider]  polyethylene glycol (MIRALAX / GLYCOLAX) 17 g packet Take 17 g by mouth daily as needed for moderate constipation.    [provider]  QVAR REDIHALER 80 MCG/ACT inhaler Inhale 2 puffs into the lungs 2 (two) times daily.    [provider]  sildenafil (VIAGRA) 50 MG tablet Take 50 mg by mouth daily as needed for erectile dysfunction.    [provider]  tamsulosin  (FLOMAX ) 0.4 MG CAPS capsule Take 0.4 mg by mouth at bedtime.    [provider]  tetrahydrozoline-zinc (VISINE-AC) 0.05-0.25 % ophthalmic solution 2 drops 3 (three) times daily as needed (irritated eyes.).    [provider]  Tezepelumab-ekko 210 MG/1. SOAJ Inject 210 mg into the skin every 28 (twenty-eight) days. 02/17/23   [provider]  Tiotropium Bromide Monohydrate (SPIRIVA RESPIMAT) 1.25 MCG/ACT AERS Inhale 2 each into the lungs in the morning.    [provider]  tiZANidine (ZANAFLEX) 4 MG tablet Take 4 mg by mouth at bedtime.    [provider]  zileuton (ZYFLO CR) 600 MG CR tablet Take 1,200 mg by mouth in the morning and at bedtime. 10/23/16   [provider]  zinc sulfate, 50mg  elemental zinc, 220 (50 Zn) MG capsule Take 220 mg by mouth in the morning.    [provider]     No family history on file.  Social History   Socioeconomic History   Marital status: Married    Spouse name: Not on file   Number of children: Not on file   Years of education: Not on file   Highest education level: Not on file  Occupational History   Not on file  Tobacco Use   Smoking status: Never   Smokeless tobacco: Never  Vaping Use   Vaping status: Never Used  Substance and Sexual Activity   Alcohol use:  Yes    Comment: rare   Drug use: No   Sexual activity: Not on file  Other Topics Concern   Not on file  Social History Narrative   Not on file   Social Drivers of Health   Financial Resource Strain: Low Risk  (01/07/2024)   Received from [facility]   Overall Financial Resource Strain (CARDIA)    Difficulty of Paying Living Expenses: Not hard at all  Food Insecurity: No Food Insecurity (01/07/2024)   Received from [facility]   Hunger Vital Sign    Within the past 12 months, you worried that your food would run out before you got the money to buy more.: Never true    Within the past 12 months, the food you bought just didn't last and you didn't have money to get more.: Never true  Transportation Needs: No Transportation Needs (01/07/2024)   Received from [facility] - Transportation    Lack of Transportation (Medical): No    Lack of Transportation (Non-Medical): No  Physical Activity: Insufficiently Active (01/07/2024)   Received from [facility]   Exercise Vital Sign    On average, how many days per week do you engage in moderate to strenuous exercise (like a brisk walk)?: 2 days    On average, how many minutes do you engage in exercise at this level?: 20 min  Stress: No Stress Concern Present (01/07/2024)   Received from [facility] - Occupational Stress Questionnaire    Feeling of Stress : Not at all  Social Connections: Socially Integrated (01/07/2024)   Received from [facility]   Social Network    How would you rate your social network (family, work, friends)?: Good participation with social networks     Vital Signs: There were no vitals taken for this visit.  Physical Exam Deferred secondary to virtual visit.  Imaging:     IR CT MWA, 12/02/23 IMPRESSION:  Successful CT-guided percutaneous biopsy and microwave thermal ablation of a 1.5 cm posterior midpole RIGHT renal mass.  MR Abd W/WO,  03/15/24 IMPRESSION:  1. Satisfactory post cryoablation changes in the right kidney interpolar region. No residual or recurrent tumor seen.  2. No metastatic disease identified within the abdomen.  Labs:  CBC: Recent Labs    07/21/23 1351 11/24/23 1113 12/02/23 0650  WBC 10.2 19.2* 10.2  HGB 14.5 15.7 15.5  HCT 42.9 48.9 48.1  PLT 215 255 186    COAGS: Recent Labs    11/24/23 1113  INR 1.0    BMP: Recent Labs    07/21/23 1351 11/24/23 1113  NA 136 138  K 3.6 4.4  CL 105 105  CO2 20* 21*  GLUCOSE 167* 112*  BUN 18 17  CALCIUM  9.2 9.5  CREATININE 1.33* 0.98  GFRNONAA >60 >60    Pathology:   SURGICAL PATHOLOGY CASE: [id number] PATIENT: Tarvaris Kuehl Surgical Pathology Report   Clinical History: Right renal mass, Q RCC (las)   FINAL MICROSCOPIC DIAGNOSIS:   A. RENAL MASS, RIGHT, BIOPSY: Clear cell renal cell carcinoma, nuclear grade 2.   Assessment and Plan:  63 y/o M w PMHx of T2DM on metformin, HTN, CAD s/p PCI and DES (2019) on ASA 81, asthma (severe), back pain s/p L2-3 fusion (04/02/23) and renal stones s/p lithotripsies. 1.2 cm enhancing R renal mass suspicious for RCC incidentally found on ER workup.    Pt now s/p R renal mass biopsy and microwave ablation on 12/02/23 Pathology positive for clear cell RCC   *Pt back to baseline. No post procedural concern. *6 mos interval, 9 most post ablation follow up imaging w multiphasic MR post ablation (12/02/23). *Will see him back, in virtual clinic, for imaging review in 6 mos. *Pt will also follow up with his Urologist.   Thank you for allowing us  to participate in the care of this Patient.  Electronically Signed:  Thom Hall, MD [facility] Radiology   Pager. [phone] Clinic. [phone]  As part of this video-telephonic encounter, no in-person exam was conducted.   The patient was physically located in [place]  or a state in  which I am permitted to provide care. The encounter was reasonable and appropriate under the circumstances given the patient's presentation at the time.   I spent a total of 25 Minutes of non-face-to-face time in clinical consultation, greater than 50% of which was counseling/coordinating care for Mr. BLAIRE HODSDON' evaluation for renal mass treatment.

## 2024-04-19 NOTE — Addendum Note (Signed)
 Encounter addended by: Hughes Simmonds, MD on: 04/19/2024 7:00 AM  Actions taken: Clinical Note Signed

## 2024-05-30 ENCOUNTER — Other Ambulatory Visit: Payer: Self-pay | Admitting: Orthopedic Surgery

## 2024-06-08 NOTE — Patient Instructions (Signed)
 SURGICAL WAITING ROOM VISITATION  Patients having surgery or a procedure may have no more than 2 support people in the waiting area - these visitors may rotate.    Children ages 32 and under will not be able to visit patients in [facility] under most circumstances.   Visitors with respiratory illnesses are discouraged from visiting and should remain at home.  If the patient needs to stay at the hospital during part of their recovery, the visitor guidelines for inpatient rooms apply. Pre-op nurse will coordinate an appropriate time for 1 support person to accompany patient in pre-op.  This support person may not rotate.    Please refer to the [facility] website for the visitor guidelines for Inpatients (after your surgery is over and you are in a regular room).       Your procedure is scheduled on: 06-20-24   Report to [facility] Main Entrance    Report to admitting at     0515 AM   Call this number if you have problems the morning of surgery [phone]   Do not eat food :After Midnight.   After Midnight you may have the following liquids until __0415 ____ AM/ DAY OF SURGERY   then nothing by mouth   Water  Non-Citrus Juices (without pulp, NO RED-Apple, White grape, White cranberry) Black Coffee (NO MILK/CREAM OR CREAMERS, sugar ok)  Clear Tea (NO MILK/CREAM OR CREAMERS, sugar ok) regular and decaf                             Plain Jell-O (NO RED)                                           Fruit ices (not with fruit pulp, NO RED)                                     Popsicles (NO RED)                                                               Sports drinks like Gatorade (NO RED)                    The day of surgery:  Drink ONE (1) Pre-Surgery G2 BY    0415   AM the morning of surgery. Drink in one sitting. Do not sip.  This drink was given to you during your hospital  pre-op appointment visit. Nothing else to drink after completing the  Pre-Surgery  G2.          If you have questions, please contact your surgeons office.   FOLLOW  ANY ADDITIONAL PRE OP INSTRUCTIONS YOU RECEIVED FROM YOUR SURGEON'S OFFICE!!!     Oral Hygiene is also important to reduce your risk of infection.                                    Remember - BRUSH YOUR TEETH THE  MORNING OF SURGERY WITH YOUR REGULAR TOOTHPASTE  DENTURES WILL BE REMOVED PRIOR TO SURGERY PLEASE DO NOT APPLY Poly grip OR ADHESIVES!!!   Do NOT smoke after Midnight   Stop all vitamins and herbal supplements 7 days before surgery.   Take these medicines the morning of surgery with A SIP OF WATER : Zileuton, inhalers and bring rescue inhaler with you, pregabalin, pantoprazole  if needed  DO NOT TAKE ANY ORAL DIABETIC MEDICATIONS DAY OF YOUR SURGERY  HOLD OZEMPIC 7 DAYS BEFORE SURGERY  Bring CPAP mask and tubing day of surgery.                              You may not have any metal on your body including hair pins, jewelry, and body piercing             Do not wear  lotions, powders, /cologne, or deodorant                Men may shave face and neck.   Do not bring valuables to the hospital.  IS NOT             RESPONSIBLE   FOR VALUABLES.   Contacts, glasses, dentures or bridgework may not be worn into surgery.   Bring small overnight bag day of surgery.   DO NOT BRING YOUR HOME MEDICATIONS TO THE HOSPITAL. PHARMACY WILL DISPENSE MEDICATIONS LISTED ON YOUR MEDICATION LIST TO YOU DURING YOUR ADMISSION IN THE HOSPITAL!    Patients discharged on the day of surgery will not be allowed to drive home.  Someone NEEDS to stay with you for the first 24 hours after anesthesia.   Special Instructions: Bring a copy of your healthcare power of attorney and living will documents the day of surgery if you haven't scanned them before.              Please read over the following fact sheets you were given: IF YOU HAVE QUESTIONS ABOUT YOUR PRE-OP INSTRUCTIONS PLEASE CALL  [phone].    If you test positive for Covid or have been in contact with anyone that has tested positive in the last 10 days please notify you surgeon.      Pre-operative 4 CHG Bath Instructions  DYNA-Hex 4 Chlorhexidine  Gluconate 4% Solution Antiseptic 4 fl. oz   You can play a key role in reducing the risk of infection after surgery. Your skin needs to be as free of germs as possible. You can reduce the number of germs on your skin by washing with CHG (chlorhexidine  gluconate) soap before surgery. CHG is an antiseptic soap that kills germs and continues to kill germs even after washing.   DO NOT use if you have an allergy to chlorhexidine /CHG or antibacterial soaps. If your skin becomes reddened or irritated, stop using the CHG and notify one of our RNs at   Please shower with the CHG soap starting 4 days before surgery using the following schedule:     Please keep in mind the following:  DO NOT shave, including legs and underarms, starting the day of your first shower.   You may shave your face at any point before/day of surgery.  Place clean sheets on your bed the day you start using CHG soap. Use a clean washcloth (not used since being washed) for each shower. DO NOT sleep with pets once you start using the CHG.  CHG Shower Instructions:  If you  choose to wash your hair and private area, wash first with your normal shampoo/soap.  After you use shampoo/soap, rinse your hair and body thoroughly to remove shampoo/soap residue.  Turn the water  OFF and apply about 3 tablespoons (45 ml) of CHG soap to a CLEAN washcloth.  Apply CHG soap ONLY FROM YOUR NECK DOWN TO YOUR TOES (washing for 3-5 minutes)  DO NOT use CHG soap on face, private areas, open wounds, or sores.  Pay special attention to the area where your surgery is being performed.  If you are having back surgery, having someone wash your back for you may be helpful. Wait 2 minutes after CHG soap is applied, then you may rinse  off the CHG soap.  Pat dry with a clean towel  Put on clean clothes/pajamas   If you choose to wear lotion, please use ONLY the CHG-compatible lotions on the back of this paper.     Additional instructions for the day of surgery: DO NOT APPLY any lotions, deodorants, cologne, or perfumes.   Put on clean/comfortable clothes.  Brush your teeth.  Ask your nurse before applying any prescription medications to the skin.   CHG Compatible Lotions   Aveeno Moisturizing lotion  Cetaphil Moisturizing Cream  Cetaphil Moisturizing Lotion  Clairol Herbal Essence Moisturizing Lotion, Dry Skin  Clairol Herbal Essence Moisturizing Lotion, Extra Dry Skin  Clairol Herbal Essence Moisturizing Lotion, Normal Skin  Curel Age Defying Therapeutic Moisturizing Lotion with Alpha Hydroxy  Curel Extreme Care Body Lotion  Curel Soothing Hands Moisturizing Hand Lotion  Curel Therapeutic Moisturizing Cream, Fragrance-Free  Curel Therapeutic Moisturizing Lotion, Fragrance-Free  Curel Therapeutic Moisturizing Lotion, Original Formula  Eucerin Daily Replenishing Lotion  Eucerin Dry Skin Therapy Plus Alpha Hydroxy Crme  Eucerin Dry Skin Therapy Plus Alpha Hydroxy Lotion  Eucerin Original Crme  Eucerin Original Lotion  Eucerin Plus Crme Eucerin Plus Lotion  Eucerin TriLipid Replenishing Lotion  Keri Anti-Bacterial Hand Lotion  Keri Deep Conditioning Original Lotion Dry Skin Formula Softly Scented  Keri Deep Conditioning Original Lotion, Fragrance Free Sensitive Skin Formula  Keri Lotion Fast Absorbing Fragrance Free Sensitive Skin Formula  Keri Lotion Fast Absorbing Softly Scented Dry Skin Formula  Keri Original Lotion  Keri Skin Renewal Lotion Keri Silky Smooth Lotion  Keri Silky Smooth Sensitive Skin Lotion  Nivea Body Creamy Conditioning Oil  Nivea Body Extra Enriched Lotion  Nivea Body Original Lotion  Nivea Body Sheer Moisturizing Lotion Nivea Crme  Nivea Skin Firming Lotion  NutraDerm 30 Skin  Lotion  NutraDerm Skin Lotion  NutraDerm Therapeutic Skin Cream  NutraDerm Therapeutic Skin Lotion  ProShield Protective Hand Cream   Incentive Spirometer  An incentive spirometer is a tool that can help keep your lungs clear and active. This tool measures how well you are filling your lungs with each breath. Taking long deep breaths may help reverse or decrease the chance of developing breathing (pulmonary) problems (especially infection) following: A long period of time when you are unable to move or be active. BEFORE THE PROCEDURE  If the spirometer includes an indicator to show your best effort, your nurse or respiratory therapist will set it to a desired goal. If possible, sit up straight or lean slightly forward. Try not to slouch. Hold the incentive spirometer in an upright position. INSTRUCTIONS FOR USE  Sit on the edge of your bed if possible, or sit up as far as you can in bed or on a chair. Hold the incentive spirometer in an upright position. Breathe out  normally. Place the mouthpiece in your mouth and seal your lips tightly around it. Breathe in slowly and as deeply as possible, raising the piston or the ball toward the top of the column. Hold your breath for 3-5 seconds or for as long as possible. Allow the piston or ball to fall to the bottom of the column. Remove the mouthpiece from your mouth and breathe out normally. Rest for a few seconds and repeat Steps 1 through 7 at least 10 times every 1-2 hours when you are awake. Take your time and take a few normal breaths between deep breaths. The spirometer may include an indicator to show your best effort. Use the indicator as a goal to work toward during each repetition. After each set of 10 deep breaths, practice coughing to be sure your lungs are clear. If you have an incision (the cut made at the time of surgery), support your incision when coughing by placing a pillow or rolled up towels firmly against it. Once you are  able to get out of bed, walk around indoors and cough well. You may stop using the incentive spirometer when instructed by your caregiver.  RISKS AND COMPLICATIONS Take your time so you do not get dizzy or light-headed. If you are in pain, you may need to take or ask for pain medication before doing incentive spirometry. It is harder to take a deep breath if you are having pain. AFTER USE Rest and breathe slowly and easily. It can be helpful to keep track of a log of your progress. Your caregiver can provide you with a simple table to help with this. If you are using the spirometer at home, follow these instructions: SEEK MEDICAL CARE IF:  You are having difficultly using the spirometer. You have trouble using the spirometer as often as instructed. Your pain medication is not giving enough relief while using the spirometer. You develop fever of 100.5 F (38.1 C) or higher. SEEK IMMEDIATE MEDICAL CARE IF:  You cough up bloody sputum that had not been present before. You develop fever of 102 F (38.9 C) or greater. You develop worsening pain at or near the incision site. MAKE SURE YOU:  Understand these instructions. Will watch your condition. Will get help right away if you are not doing well or get worse. Document Released: 10/27/2006 Document Revised: 09/08/2011 Document Reviewed: 12/28/2006 [facility] Patient Information 2014 [place], [place].   ________________________________________________________________________

## 2024-06-08 NOTE — Progress Notes (Addendum)
 PCP - Fairy Elnor PIETY LOV 05-23-24 epic preop eval Cardiologist - LOV 09-28-23 EPIC Almarie Fischer, ACNP   televisit 03-17-24 clearance/ Dr. Irven   PPM/ICD -  Device Orders -  Rep Notified -   Chest x-ray -  EKG - 12-02-23 EPIC Stress Test -  ECHO -  Cardiac Cath -  LABS-HgbA1C   05-23-24 EPIC 6.7  Sleep Study - OSA had surgery CPAP -   OZEMPIC last dose -06-10-24 Fasting Blood Sugar -  Checks Blood Sugar __o___ times a day  Blood Thinner Instructions: Aspirin  Instructions: 81mg  asa  ERAS Protcol - PRE-SURGERY G2-    COVID vaccine -  Activity--Able to climb a flight of stairs with no CP or SOB  Anesthesia review: CAD/stent STEMI  2019, HTN, OSA, Asthma, DM 2  Patient denies shortness of breath, fever, cough and chest pain at PAT appointment   All instructions explained to the patient, with a verbal understanding of the material. Patient agrees to go over the instructions while at home for a better understanding. Patient also instructed to self quarantine after being tested for COVID-19. The opportunity to ask questions was provided.

## 2024-06-10 NOTE — Telephone Encounter (Signed)
 This is not active on pt med list and was discontinued by MD Georgina on 02/16/23. Pt should contact us  if this is something that they want filled. There is no mention of it in last OV note.

## 2024-06-13 ENCOUNTER — Encounter (HOSPITAL_COMMUNITY): Payer: Self-pay

## 2024-06-13 ENCOUNTER — Other Ambulatory Visit: Payer: Self-pay

## 2024-06-13 ENCOUNTER — Ambulatory Visit (HOSPITAL_COMMUNITY)
Admission: RE | Admit: 2024-06-13 | Discharge: 2024-06-13 | Disposition: A | Source: Ambulatory Visit | Attending: Orthopedic Surgery | Admitting: Orthopedic Surgery

## 2024-06-13 ENCOUNTER — Encounter (HOSPITAL_COMMUNITY): Admission: RE | Admit: 2024-06-13 | Discharge: 2024-06-13 | Attending: Orthopedic Surgery

## 2024-06-13 VITALS — BP 140/89 | HR 81 | Temp 98.2°F | Resp 16 | Ht 69.0 in | Wt 215.0 lb

## 2024-06-13 DIAGNOSIS — I2510 Atherosclerotic heart disease of native coronary artery without angina pectoris: Secondary | ICD-10-CM | POA: Diagnosis not present

## 2024-06-13 DIAGNOSIS — Z01812 Encounter for preprocedural laboratory examination: Secondary | ICD-10-CM | POA: Diagnosis present

## 2024-06-13 DIAGNOSIS — K219 Gastro-esophageal reflux disease without esophagitis: Secondary | ICD-10-CM | POA: Insufficient documentation

## 2024-06-13 DIAGNOSIS — Z981 Arthrodesis status: Secondary | ICD-10-CM | POA: Diagnosis not present

## 2024-06-13 DIAGNOSIS — J45909 Unspecified asthma, uncomplicated: Secondary | ICD-10-CM | POA: Diagnosis not present

## 2024-06-13 DIAGNOSIS — E119 Type 2 diabetes mellitus without complications: Secondary | ICD-10-CM | POA: Diagnosis not present

## 2024-06-13 DIAGNOSIS — G4733 Obstructive sleep apnea (adult) (pediatric): Secondary | ICD-10-CM | POA: Insufficient documentation

## 2024-06-13 DIAGNOSIS — I252 Old myocardial infarction: Secondary | ICD-10-CM | POA: Insufficient documentation

## 2024-06-13 DIAGNOSIS — Z01818 Encounter for other preprocedural examination: Secondary | ICD-10-CM | POA: Diagnosis present

## 2024-06-13 DIAGNOSIS — M1711 Unilateral primary osteoarthritis, right knee: Secondary | ICD-10-CM | POA: Insufficient documentation

## 2024-06-13 DIAGNOSIS — Z955 Presence of coronary angioplasty implant and graft: Secondary | ICD-10-CM | POA: Diagnosis not present

## 2024-06-13 DIAGNOSIS — Z01811 Encounter for preprocedural respiratory examination: Secondary | ICD-10-CM | POA: Diagnosis present

## 2024-06-13 DIAGNOSIS — K279 Peptic ulcer, site unspecified, unspecified as acute or chronic, without hemorrhage or perforation: Secondary | ICD-10-CM | POA: Insufficient documentation

## 2024-06-13 DIAGNOSIS — K449 Diaphragmatic hernia without obstruction or gangrene: Secondary | ICD-10-CM | POA: Insufficient documentation

## 2024-06-13 DIAGNOSIS — I10 Essential (primary) hypertension: Secondary | ICD-10-CM | POA: Insufficient documentation

## 2024-06-13 HISTORY — DX: Personal history of other diseases of the digestive system: Z87.19

## 2024-06-13 LAB — BASIC METABOLIC PANEL WITH GFR
Anion gap: 9 (ref 5–15)
BUN: 11 mg/dL (ref 8–23)
CO2: 25 mmol/L (ref 22–32)
Calcium: 9.8 mg/dL (ref 8.9–10.3)
Chloride: 107 mmol/L (ref 98–111)
Creatinine, Ser: 0.91 mg/dL (ref 0.61–1.24)
GFR, Estimated: >60 mL/min (ref >60)
Glucose, Bld: 106 mg/dL — ABNORMAL HIGH (ref 70–99)
Potassium: 4.6 mmol/L (ref 3.5–5.1)
Sodium: 141 mmol/L (ref 135–145)

## 2024-06-13 LAB — CBC
HCT: 45.8 % (ref 39.0–52.0)
Hemoglobin: 14.8 g/dL (ref 13.0–17.0)
MCH: 30.5 pg (ref 26.0–34.0)
MCHC: 32.3 g/dL (ref 30.0–36.0)
MCV: 94.4 fL (ref 80.0–100.0)
Platelets: 200 K/uL (ref 150–400)
RBC: 4.85 MIL/uL (ref 4.22–5.81)
RDW: 14.5 % (ref 11.5–15.5)
WBC: 11.9 K/uL — ABNORMAL HIGH (ref 4.0–10.5)
nRBC: 0 % (ref 0.0–0.2)

## 2024-06-13 LAB — GLUCOSE, CAPILLARY: Glucose-Capillary: 94 mg/dL (ref 70–99)

## 2024-06-13 LAB — SURGICAL PCR SCREEN
MRSA, PCR: NEGATIVE
Staphylococcus aureus: NEGATIVE

## 2024-06-14 ENCOUNTER — Encounter (HOSPITAL_COMMUNITY): Payer: Self-pay

## 2024-06-14 NOTE — Anesthesia Preprocedure Evaluation (Signed)
 Anesthesia Evaluation    Airway        Dental   Pulmonary           Cardiovascular hypertension,      Neuro/Psych    GI/Hepatic   Endo/Other  diabetes    Renal/GU      Musculoskeletal   Abdominal   Peds  Hematology   Anesthesia Other Findings   Reproductive/Obstetrics                              Anesthesia Physical Anesthesia Plan  ASA:   Anesthesia Plan:    Post-op Pain Management:    Induction:   PONV Risk Score and Plan:   Airway Management Planned:   Additional Equipment:   Intra-op Plan:   Post-operative Plan:   Informed Consent:   Plan Discussed with:   Anesthesia Plan Comments: (See PAT note from 12/15)         Anesthesia Quick Evaluation

## 2024-06-14 NOTE — Progress Notes (Signed)
 Case: [id number] Date/Time: 06/20/24 0700   Procedure: ARTHROPLASTY, KNEE, TOTAL (Right: Knee)   Anesthesia type: Spinal   Pre-op diagnosis: RIGHT KNEE OSTEOARTHRITIS   [facility] 08 [facility]   Surgeons: Hunter Rush, MD       DISCUSSION: Hunter Krause is a 63 yo male with PMH of HTN, hx of STEMI and CAD s/p DES to RCA (2019), OSA (no CPAP use), asthma, GERD, PUD, hiatal hernia, T2DM, RCC s/p ablation (11/2023), arthritis, s/p lumbar fusion L2-3 (03/2023).  Patient follows with Cardiology at [facility] for hx of MI and CAD s/p DES to RCA in 2019. Had a NM stress test on 08/03/23 which was low risk. Last seen in clinic on 09/28/23. Stable at that visit. Advised continue current meds and f/u in 1 year. Surgical risk assessment done (see 03/17/24 telephone encounter):  REVISED CARDIAC RISK INDEX POLICY   Hunter Krause is having a non-cardiac surgical procedure for RT TKA and requires risk stratification.     Clinical risk factors (1 point each) Patient has: -CAD: Yes -STROKE/TIA: No -CHF (compensated or prior): No -CKD (Cr >2): No -Insulin  dependent DM:  No -High risk surgery (any vascular, abdominal or thoracic): No   RCRI score: 1. Intermediate Risk   Functional capacity: >4 mets without angina as determined by stress test completed on 08/03/23 METS achieved 6.4   LVEF 50-55%   No further cardiac testing is warranted.  Patient should continue betablocker through surgery as clinically able if applicable   Patient follows with Pulmonology for hx of severe persistent asthma with chronic nasal obstruction. Last seen on 05/03/24. He reported having throat tightness however per his Pulmonologist she did not feel it was related to asthma. He was treated empirically for thrush which 2-4 weeks of fluconazole. Per Dr. Georgina: He is using his accessory muscles to brace his airway.  I think the first thing we should do is to refer him to the voice center for evaluation with  indirect laryngoscopy.  He saw ENT on 05/23/24. He was diagnosed with muscle tension dysphonia and mouth breathing. Patient also was using afrin daily and advised to d/c.  At PAT visit patient reports he can do stairs without CP/SOB. Anticipate he can proceed.  VS: BP (!) 140/89   Pulse 81   Temp 36.8 C (Oral)   Resp 16   Ht 5' 9 (1.753 m)   Wt 97.5 kg   SpO2 99%   BMI 31.75 kg/m   PROVIDERS: Hunter Locus, MD   LABS: Labs reviewed: Acceptable for surgery. (all labs ordered are listed, but only abnormal results are displayed)  Labs Reviewed  BASIC METABOLIC PANEL WITH GFR - Abnormal; Notable for the following components:      Result Value   Glucose, Bld 106 (*)    All other components within normal limits  CBC - Abnormal; Notable for the following components:   WBC 11.9 (*)    All other components within normal limits  SURGICAL PCR SCREEN  GLUCOSE, CAPILLARY    PFTs 05/03/24 ([facility]):  Spirometry demonstrates normal airflow, without obstruction. Lung volume measurement was not performed, but normal FVC and FEV1-FVC are highly reliable at ruling out restriction.   EKG 12/02/23:  Sinus rhythm with occasional PVCs Left axis deviation Minimal voltage criteria for LVH, may be a normal variant (R in aVL) Inferior infarct, age undetermined   NM stress test 08/03/23 ([facility]):  Impression 1.) LIMITATIONS: None. 2.) MYOCARDIAL PERFUSION: No suspicious fixed or inducible perfusion  defects. 3.) LEFT VENTRICULAR EJECTION FRACTION: Normal. 4.) REGIONAL WALL MOTION: Normal.  Echo 08/08/2020 ([facility]):   SUMMARY The left ventricular size is normal. Mild left ventricular hypertrophy LV ejection fraction = 60-65%. Left ventricular systolic function is normal. Left ventricular filling pattern is prolonged relaxation. The right ventricle is normal in size and function. There is mild mitral regurgitation. No significant stenosis seen There was insufficient TR detected to  calculate RV systolic pressure. The inferior vena cava was not visualized during the exam. There is no pericardial effusion. Past Medical History:  Diagnosis Date   Anemia    Arthritis    Asthma 1994   allergy induced asthma   Cancer (HCC)    kidney lesion   Coronary artery disease    Diabetes mellitus without complication (HCC) type 2    GERD (gastroesophageal reflux disease)    GI bleed 12/01/2021   admit to [facility]   GI bleed 12/01/2021   admit to [facility]   Gout    no recent flares   History of hiatal hernia    History of kidney stones    Hypertension    treated with hydrocholothiazide   Myocardial infarction (HCC) 2019   Pneumonia    Sleep apnea    treated with surgery. No problems now.    Past Surgical History:  Procedure Laterality Date   ARTHROSCOPY KNEE W/ DRILLING  2004   right knee   Blephroplasty  upper     cervical neck fusion     C 3 to C 5 has plates and screws   CHOLECYSTECTOMY     CORONARY ANGIOPLASTY  2019   stent x 1   CORONARY STENT INTERVENTION     CYSTOSCOPY/URETEROSCOPY/HOLMIUM LASER/STENT PLACEMENT Left 09/10/2020   Procedure: CYSTOSCOPY LEFT URETEROSCOPY/HOLMIUM LASER/STENT PLACEMENT;  Surgeon: Carolee Sherwood JONETTA DOUGLAS, MD;  Location: [facility];  Service: Urology;  Laterality: Left;   CYSTOSCOPY/URETEROSCOPY/HOLMIUM LASER/STENT PLACEMENT Bilateral 05/28/2022   Procedure: CYSTOSCOPY/ RETROGRADE/URETEROSCOPY/HOLMIUM LASER/STENT PLACEMENT;  Surgeon: [name], MD;  Location[facility];  Service: Urology;  Laterality: Bilateral;   EXTRACORPOREAL SHOCK WAVE LITHOTRIPSY Left 09/03/2020   Procedure: LEFT EXTRACORPOREAL SHOCK WAVE LITHOTRIPSY (ESWL);  Surgeon: Carolee Sherwood JONETTA DOUGLAS, MD;  Location[facility];  Service: Urology;  Laterality: Left;   IR RADIOLOGIST EVAL & MGMT  09/23/2023   IR RADIOLOGIST EVAL & MGMT  12/22/2023   IR RADIOLOGIST EVAL & MGMT  03/17/2024   LITHOTRIPSY  2001   lower back  2020    fusion    X2    L 4 to L 6   L2 L3   NASAL SEPTUM SURGERY  2005   palate remove for osa   RADIOLOGY WITH ANESTHESIA Right 12/02/2023   Procedure: CT WITH ANESTHESIA;  Surgeon: Radiologist, Medication, MD;  Location[facility];  Service: Radiology;  Laterality: Right;    MEDICATIONS:  cetirizine (ZYRTEC) 10 MG chewable tablet   diphenhydrAMINE  (BENADRYL ) 25 mg capsule   albuterol (PROVENTIL HFA;VENTOLIN HFA) 108 (90 BASE) MCG/ACT inhaler   allopurinol (ZYLOPRIM) 300 MG tablet   aspirin  EC 81 MG tablet   atenolol  (TENORMIN ) 25 MG tablet   atorvastatin  (LIPITOR) 40 MG tablet   calcium  carbonate (TUMS - DOSED IN MG ELEMENTAL CALCIUM ) 500 MG chewable tablet   Cholecalciferol (VITAMIN D3 PO)   clotrimazole (MYCELEX) 10 MG troche   cyanocobalamin (VITAMIN B12) 1000 MCG tablet   diclofenac Sodium (VOLTAREN) 1 % GEL   EPINEPHrine 0.3 mg/0.3 mL IJ SOAJ injection  HYDROcodone-acetaminophen  (NORCO) 10-325 MG tablet   MAGNESIUM PO   MELATONIN PO   metFORMIN (GLUCOPHAGE-XR) 500 MG 24 hr tablet   nitroGLYCERIN  (NITROSTAT ) 0.4 MG SL tablet   pantoprazole  (PROTONIX ) 40 MG tablet   polyethylene glycol (MIRALAX / GLYCOLAX) 17 g packet   pregabalin (LYRICA) 25 MG capsule   QVAR REDIHALER 80 MCG/ACT inhaler   Semaglutide, 1 MG/DOSE, 4 MG/3ML SOPN   sildenafil (VIAGRA) 50 MG tablet   tamsulosin  (FLOMAX ) 0.4 MG CAPS capsule   Tezepelumab-ekko 210 MG/1. SOAJ   Tiotropium Bromide Monohydrate (SPIRIVA RESPIMAT) 1.25 MCG/ACT AERS   tiZANidine (ZANAFLEX) 4 MG tablet   zileuton (ZYFLO CR) 600 MG CR tablet   ZINC SULFATE PO   No current facility-administered medications for this encounter.

## 2024-06-19 DIAGNOSIS — M1711 Unilateral primary osteoarthritis, right knee: Secondary | ICD-10-CM | POA: Diagnosis present

## 2024-06-19 NOTE — H&P (Signed)
 TOTAL KNEE ADMISSION H&P  Patient is being admitted for right total knee arthroplasty.  Subjective:  Chief Complaint:right knee pain.  HPI: Hunter Krause, 63 y.o. male, has a history of pain and functional disability in the right knee due to arthritis and has failed non-surgical conservative treatments for greater than 12 weeks to includeNSAID's and/or analgesics, corticosteriod injections, flexibility and strengthening excercises, weight reduction as appropriate, and activity modification.  Onset of symptoms was gradual, starting several years ago with gradually worsening course since that time. The patient noted no past surgery on the right knee(s).  Patient currently rates pain in the right knee(s) at 10 out of 10 with activity. Patient has night pain, worsening of pain with activity and weight bearing, pain that interferes with activities of daily living, pain with passive range of motion, crepitus, and joint swelling.  Patient has evidence of joint space narrowing by imaging studies.There is no active infection.  There are no active problems to display for this patient.  Past Medical History:  Diagnosis Date   Arthritis    Asthma 1994   allergy induced asthma   Cancer (HCC)    kidney lesion   Coronary artery disease    Diabetes mellitus without complication (HCC) type 2    GERD (gastroesophageal reflux disease)    GI bleed 12/01/2021   admit to [facility]   GI bleed 12/01/2021   admit to [facility]   Gout    no recent flares   History of hiatal hernia    History of kidney stones    Hypertension    treated with hydrocholothiazide   Myocardial infarction (HCC) 2019   Pneumonia    Sleep apnea    treated with surgery. No problems now.    Past Surgical History:  Procedure Laterality Date   ARTHROSCOPY KNEE W/ DRILLING  2004   right knee   Blephroplasty  upper     cervical neck fusion     C 3 to C 5 has plates and screws   CHOLECYSTECTOMY     CORONARY ANGIOPLASTY  2019    stent x 1   CORONARY STENT INTERVENTION     CYSTOSCOPY/URETEROSCOPY/HOLMIUM LASER/STENT PLACEMENT Left 09/10/2020   Procedure: CYSTOSCOPY LEFT URETEROSCOPY/HOLMIUM LASER/STENT PLACEMENT;  Surgeon: Carolee Sherwood JONETTA DOUGLAS, MD;  Location: [facility];  Service: Urology;  Laterality: Left;   CYSTOSCOPY/URETEROSCOPY/HOLMIUM LASER/STENT PLACEMENT Bilateral 05/28/2022   Procedure: CYSTOSCOPY/ RETROGRADE/URETEROSCOPY/HOLMIUM LASER/STENT PLACEMENT;  Surgeon: [name], MD;  Location[facility];  Service: Urology;  Laterality: Bilateral;   EXTRACORPOREAL SHOCK WAVE LITHOTRIPSY Left 09/03/2020   Procedure: LEFT EXTRACORPOREAL SHOCK WAVE LITHOTRIPSY (ESWL);  Surgeon: Carolee Sherwood JONETTA DOUGLAS, MD;  Location: [facility];  Service: Urology;  Laterality: Left;   IR RADIOLOGIST EVAL & MGMT  09/23/2023   IR RADIOLOGIST EVAL & MGMT  12/22/2023   IR RADIOLOGIST EVAL & MGMT  03/17/2024   LITHOTRIPSY  2001   lower back  2020   fusion    X2    L 4 to L 6   L2 L3   NASAL SEPTUM SURGERY  2005   palate remove for osa   RADIOLOGY WITH ANESTHESIA Right 12/02/2023   Procedure: CT WITH ANESTHESIA;  Surgeon: Radiologist, Medication, MD;  Location: [facility];  Service: Radiology;  Laterality: Right;    No current facility-administered medications for this encounter.   Current Outpatient Medications  Medication Sig Dispense Refill Last Dose/Taking   albuterol (PROVENTIL HFA;VENTOLIN HFA) 108 (90 BASE) MCG/ACT inhaler  Inhale 2 puffs into the lungs every 6 (six) hours as needed for wheezing or shortness of breath.   Taking As Needed   allopurinol (ZYLOPRIM) 300 MG tablet Take 300 mg by mouth at bedtime.   Taking   aspirin  EC 81 MG tablet Take 81 mg by mouth every evening.   Taking   atenolol  (TENORMIN ) 25 MG tablet Take 25 mg by mouth at bedtime.   Taking   atorvastatin  (LIPITOR) 40 MG tablet Take 40 mg by mouth every evening.   Taking   calcium  carbonate (TUMS - DOSED IN MG ELEMENTAL  CALCIUM ) 500 MG chewable tablet Chew 1 tablet by mouth 3 (three) times daily as needed for indigestion or heartburn.   Taking As Needed   Cholecalciferol (VITAMIN D3 PO) Take 1,000 Units by mouth in the morning.   Taking   clotrimazole (MYCELEX) 10 MG troche Take 10 mg by mouth 5 (five) times daily as needed (thrush).   Taking As Needed   cyanocobalamin (VITAMIN B12) 1000 MCG tablet Take 1,000 mcg by mouth in the morning.   Taking   diclofenac Sodium (VOLTAREN) 1 % GEL Apply 2 g topically daily as needed (knee pain).   Taking As Needed   EPINEPHrine  0.3 mg/0.3 mL IJ SOAJ injection Inject 0.3 mg into the muscle as needed for anaphylaxis.   Taking As Needed   HYDROcodone-acetaminophen  (NORCO) 10-325 MG tablet Take 1 tablet by mouth every 6 (six) hours as needed (pain.).   Taking As Needed   MAGNESIUM PO Take 1 capsule by mouth in the morning.   Taking   MELATONIN PO Take 12 mg by mouth at bedtime.   Taking   metFORMIN (GLUCOPHAGE-XR) 500 MG 24 hr tablet Take 1,000 mg by mouth in the morning.   Taking   nitroGLYCERIN  (NITROSTAT ) 0.4 MG SL tablet Place 0.4 mg under the tongue every 5 (five) minutes x 3 doses as needed for chest pain.   Taking As Needed   pantoprazole  (PROTONIX ) 40 MG tablet Take 40 mg by mouth daily as needed (indigestion/heartburn.).   Taking As Needed   polyethylene glycol (MIRALAX / GLYCOLAX) 17 g packet Take 17 g by mouth daily as needed for moderate constipation.   Taking As Needed   pregabalin (LYRICA) 25 MG capsule Take 50 mg by mouth in the morning and at bedtime.   Taking   QVAR REDIHALER 80 MCG/ACT inhaler Inhale 1 puff into the lungs 2 (two) times daily.   Taking   Semaglutide, 1 MG/DOSE, 4 MG/3ML SOPN Inject 1 mg into the skin every Monday.   Taking   sildenafil (VIAGRA) 50 MG tablet Take 50 mg by mouth daily as needed for erectile dysfunction.   Taking As Needed   tamsulosin  (FLOMAX ) 0.4 MG CAPS capsule Take 0.4 mg by mouth at bedtime.   Taking   Tezepelumab-ekko 210  MG/1. SOAJ Inject 210 mg into the skin every 28 (twenty-eight) days.   Taking   Tiotropium Bromide Monohydrate (SPIRIVA RESPIMAT) 1.25 MCG/ACT AERS Inhale 2 each into the lungs in the morning.   Taking   tiZANidine  (ZANAFLEX ) 4 MG tablet Take 4 mg by mouth at bedtime.   Taking   zileuton (ZYFLO CR) 600 MG CR tablet Take 600 mg by mouth in the morning and at bedtime.   Taking   ZINC SULFATE PO Take 1 tablet by mouth in the morning.   Taking   cetirizine (ZYRTEC) 10 MG chewable tablet Chew 10 mg by mouth daily as needed for  allergies.      diphenhydrAMINE  (BENADRYL ) 25 mg capsule Take 25 mg by mouth every 6 (six) hours as needed.      Allergies[1]  Social History   Tobacco Use   Smoking status: Never   Smokeless tobacco: Never  Substance Use Topics   Alcohol use: Yes    Comment: rare    No family history on file.   Review of Systems  Constitutional: Negative.   HENT: Negative.    Eyes: Negative.   Respiratory: Negative.    Cardiovascular: Negative.   Gastrointestinal: Negative.   Endocrine: Negative.   Genitourinary: Negative.   Musculoskeletal:  Positive for arthralgias.  Allergic/Immunologic: Negative.   Neurological: Negative.   Hematological: Negative.   Psychiatric/Behavioral: Negative.      Objective:  Physical Exam Constitutional:      Appearance: Normal appearance. He is normal weight.  HENT:     Head: Normocephalic and atraumatic.     Nose: Nose normal.  Eyes:     Pupils: Pupils are equal, round, and reactive to light.  Cardiovascular:     Pulses: Normal pulses.  Pulmonary:     Effort: Pulmonary effort is normal.  Musculoskeletal:        General: Tenderness present.     Cervical back: Normal range of motion and neck supple.     Comments: He has medial and lateral joint line tenderness. Crepitation through range of motion. ROM is -5 degrees to 110 degrees of flexion. No instability.  Skin:    General: Skin is warm and dry.  Neurological:     General:  No focal deficit present.     Mental Status: He is alert and oriented to person, place, and time. Mental status is at baseline.  Psychiatric:        Mood and Affect: Mood normal.        Behavior: Behavior normal.        Thought Content: Thought content normal.        Judgment: Judgment normal.     Vital signs in last 24 hours:    Labs:   Estimated body mass index is 31.75 kg/m as calculated from the following:   Height as of 06/13/24: 5' 9 (1.753 m).   Weight as of 06/13/24: 97.5 kg.   Imaging Review Plain radiographs demonstrate severe degenerative joint disease of the right knee(s). The bone quality appears to be good for age and reported activity level.      Assessment/Plan:  End stage arthritis, right knee   The patient history, physical examination, clinical judgment of the provider and imaging studies are consistent with end stage degenerative joint disease of the right knee(s) and total knee arthroplasty is deemed medically necessary. The treatment options including medical management, injection therapy arthroscopy and arthroplasty were discussed at length. The risks and benefits of total knee arthroplasty were presented and reviewed. The risks due to aseptic loosening, infection, stiffness, patella tracking problems, thromboembolic complications and other imponderables were discussed. The patient acknowledged the explanation, agreed to proceed with the plan and consent was signed. Patient is being admitted for inpatient treatment for surgery, pain control, PT, OT, prophylactic antibiotics, VTE prophylaxis, progressive ambulation and ADL's and discharge planning. The patient is planning to be discharged home with home health services     Patient's anticipated LOS is less than 2 midnights, meeting these requirements: - Younger than 24 - Lives within 1 hour of care - Has a competent adult at home to recover with post-op recover -  NO history of  - Chronic pain  requiring opiods  - Diabetes  - Coronary Artery Disease  - Heart failure  - Heart attack  - Stroke  - DVT/VTE  - Cardiac arrhythmia  - Respiratory Failure/COPD  - Renal failure  - Anemia  - Advanced Liver disease      [1]  Allergies Allergen Reactions   Fluogen [Influenza Virus Vaccine] Hives   Shrimp (Diagnostic) Nausea And Vomiting and Other (See Comments)    Clammy/upset stomach/

## 2024-06-20 ENCOUNTER — Ambulatory Visit (HOSPITAL_COMMUNITY): Payer: Self-pay | Admitting: Anesthesiology

## 2024-06-20 ENCOUNTER — Encounter (HOSPITAL_COMMUNITY): Payer: Self-pay | Admitting: Medical

## 2024-06-20 ENCOUNTER — Ambulatory Visit (HOSPITAL_COMMUNITY)
Admission: RE | Admit: 2024-06-20 | Discharge: 2024-06-20 | Disposition: A | Discharge status: Home / Self Care | Source: Ambulatory Visit | Attending: Orthopedic Surgery | Admitting: Orthopedic Surgery

## 2024-06-20 ENCOUNTER — Encounter (HOSPITAL_COMMUNITY): Payer: Self-pay | Admitting: Orthopedic Surgery

## 2024-06-20 ENCOUNTER — Encounter: Admission: RE | Disposition: A | Payer: Self-pay | Attending: Orthopedic Surgery

## 2024-06-20 DIAGNOSIS — Z7985 Long-term (current) use of injectable non-insulin antidiabetic drugs: Secondary | ICD-10-CM | POA: Insufficient documentation

## 2024-06-20 DIAGNOSIS — K449 Diaphragmatic hernia without obstruction or gangrene: Secondary | ICD-10-CM | POA: Insufficient documentation

## 2024-06-20 DIAGNOSIS — J45909 Unspecified asthma, uncomplicated: Secondary | ICD-10-CM | POA: Diagnosis not present

## 2024-06-20 DIAGNOSIS — Z79899 Other long term (current) drug therapy: Secondary | ICD-10-CM | POA: Diagnosis not present

## 2024-06-20 DIAGNOSIS — N289 Disorder of kidney and ureter, unspecified: Secondary | ICD-10-CM | POA: Insufficient documentation

## 2024-06-20 DIAGNOSIS — Z955 Presence of coronary angioplasty implant and graft: Secondary | ICD-10-CM | POA: Diagnosis not present

## 2024-06-20 DIAGNOSIS — K219 Gastro-esophageal reflux disease without esophagitis: Secondary | ICD-10-CM | POA: Diagnosis not present

## 2024-06-20 DIAGNOSIS — I2510 Atherosclerotic heart disease of native coronary artery without angina pectoris: Secondary | ICD-10-CM | POA: Insufficient documentation

## 2024-06-20 DIAGNOSIS — Z7984 Long term (current) use of oral hypoglycemic drugs: Secondary | ICD-10-CM | POA: Diagnosis not present

## 2024-06-20 DIAGNOSIS — M1711 Unilateral primary osteoarthritis, right knee: Secondary | ICD-10-CM | POA: Diagnosis present

## 2024-06-20 DIAGNOSIS — E119 Type 2 diabetes mellitus without complications: Secondary | ICD-10-CM | POA: Insufficient documentation

## 2024-06-20 DIAGNOSIS — Z01818 Encounter for other preprocedural examination: Secondary | ICD-10-CM

## 2024-06-20 DIAGNOSIS — I252 Old myocardial infarction: Secondary | ICD-10-CM | POA: Insufficient documentation

## 2024-06-20 DIAGNOSIS — I10 Essential (primary) hypertension: Secondary | ICD-10-CM | POA: Insufficient documentation

## 2024-06-20 DIAGNOSIS — G4733 Obstructive sleep apnea (adult) (pediatric): Secondary | ICD-10-CM | POA: Insufficient documentation

## 2024-06-20 DIAGNOSIS — M1990 Unspecified osteoarthritis, unspecified site: Secondary | ICD-10-CM | POA: Insufficient documentation

## 2024-06-20 HISTORY — PX: TOTAL KNEE ARTHROPLASTY: SHX125

## 2024-06-20 LAB — GLUCOSE, CAPILLARY
Glucose-Capillary: 93 mg/dL (ref 70–99)
Glucose-Capillary: 109 mg/dL — ABNORMAL HIGH (ref 70–99)

## 2024-06-20 SURGERY — ARTHROPLASTY, KNEE, TOTAL
Anesthesia: Monitor Anesthesia Care | Site: Knee | Laterality: Right

## 2024-06-20 MED ORDER — INSULIN ASPART 100 UNIT/ML IJ SOLN: 0.0000 [IU] | INTRAMUSCULAR | Status: DC | PRN

## 2024-06-20 MED ORDER — SODIUM CHLORIDE 0.9 % IR SOLN
Status: DC | PRN
  Administered 2024-06-20: 1000 mL

## 2024-06-20 MED ORDER — BUPIVACAINE LIPOSOME 1.3 % IJ SUSP: 20.0000 mL | Freq: Once | INTRAMUSCULAR | Status: DC

## 2024-06-20 MED ORDER — BUPIVACAINE LIPOSOME 1.3 % IJ SUSP
INTRAMUSCULAR | Status: AC
  Filled 2024-06-20: qty 20

## 2024-06-20 MED ORDER — HYDROMORPHONE HCL 1 MG/ML IJ SOLN
INTRAMUSCULAR | Status: AC
  Filled 2024-06-20: qty 1

## 2024-06-20 MED ORDER — LACTATED RINGERS IV SOLN: INTRAVENOUS | Status: DC | PRN

## 2024-06-20 MED ORDER — TRANEXAMIC ACID-NACL 1000-0.7 MG/100ML-% IV SOLN
1000.0000 mg | Freq: Once | INTRAVENOUS | Status: AC
  Administered 2024-06-20: 1000 mg via INTRAVENOUS

## 2024-06-20 MED ORDER — LACTATED RINGERS IV BOLUS
250.0000 mL | Freq: Once | INTRAVENOUS | Status: AC
  Administered 2024-06-20: 250 mL via INTRAVENOUS

## 2024-06-20 MED ORDER — SODIUM CHLORIDE (PF) 0.9 % IJ SOLN
INTRAMUSCULAR | Status: AC
  Filled 2024-06-20: qty 50

## 2024-06-20 MED ORDER — PROPOFOL 500 MG/50ML IV EMUL
INTRAVENOUS | Status: AC
  Filled 2024-06-20: qty 50

## 2024-06-20 MED ORDER — LACTATED RINGERS IV BOLUS: 250.0000 mL | Freq: Once | INTRAVENOUS | Status: DC

## 2024-06-20 MED ORDER — TRANEXAMIC ACID-NACL 1000-0.7 MG/100ML-% IV SOLN
1000.0000 mg | INTRAVENOUS | Status: AC
  Administered 2024-06-20: 1000 mg via INTRAVENOUS
  Filled 2024-06-20: qty 100

## 2024-06-20 MED ORDER — HYDROMORPHONE HCL 1 MG/ML IJ SOLN: 0.2500 mg | INTRAMUSCULAR | Status: DC | PRN

## 2024-06-20 MED ORDER — ASPIRIN 325 MG PO TBEC: 325.0000 mg | DELAYED_RELEASE_TABLET | Freq: Two times a day (BID) | ORAL | 0 refills | Status: AC

## 2024-06-20 MED ORDER — ONDANSETRON HCL 4 MG/2ML IJ SOLN
INTRAMUSCULAR | Status: AC
  Filled 2024-06-20: qty 2

## 2024-06-20 MED ORDER — PHENYLEPHRINE HCL-NACL 20-0.9 MG/250ML-% IV SOLN
INTRAVENOUS | Status: DC | PRN
  Administered 2024-06-20: 40 ug/min via INTRAVENOUS

## 2024-06-20 MED ORDER — MIDAZOLAM HCL 5 MG/5ML IJ SOLN
INTRAMUSCULAR | Status: DC | PRN
  Administered 2024-06-20: 2 mg via INTRAVENOUS

## 2024-06-20 MED ORDER — ACETAMINOPHEN 500 MG PO TABS
1000.0000 mg | ORAL_TABLET | Freq: Once | ORAL | Status: AC
  Administered 2024-06-20: 1000 mg via ORAL
  Filled 2024-06-20: qty 2

## 2024-06-20 MED ORDER — OXYCODONE HCL 5 MG/5ML PO SOLN: 5.0000 mg | Freq: Once | ORAL | Status: DC | PRN

## 2024-06-20 MED ORDER — CEFAZOLIN SODIUM-DEXTROSE 2-4 GM/100ML-% IV SOLN: 2.0000 g | Freq: Four times a day (QID) | INTRAVENOUS | Status: DC

## 2024-06-20 MED ORDER — PROPOFOL 500 MG/50ML IV EMUL
INTRAVENOUS | Status: DC | PRN
  Administered 2024-06-20: 140 ug/kg/min via INTRAVENOUS

## 2024-06-20 MED ORDER — BUPIVACAINE-EPINEPHRINE (PF) 0.5% -1:200000 IJ SOLN
INTRAMUSCULAR | Status: AC
  Filled 2024-06-20: qty 30

## 2024-06-20 MED ORDER — DEXAMETHASONE SOD PHOSPHATE PF 10 MG/ML IJ SOLN
INTRAMUSCULAR | Status: DC | PRN
  Administered 2024-06-20: 8 mg via INTRAVENOUS

## 2024-06-20 MED ORDER — 0.9 % SODIUM CHLORIDE (POUR BTL) OPTIME
TOPICAL | Status: DC | PRN
  Administered 2024-06-20: 1000 mL

## 2024-06-20 MED ORDER — TIZANIDINE HCL 4 MG PO TABS: 4.0000 mg | ORAL_TABLET | Freq: Every day | ORAL | 0 refills | Status: AC

## 2024-06-20 MED ORDER — WATER FOR IRRIGATION, STERILE IR SOLN
Status: DC | PRN
  Administered 2024-06-20: 2000 mL

## 2024-06-20 MED ORDER — LACTATED RINGERS IV BOLUS
500.0000 mL | Freq: Once | INTRAVENOUS | Status: AC
  Administered 2024-06-20: 500 mL via INTRAVENOUS

## 2024-06-20 MED ORDER — LACTATED RINGERS IV SOLN: INTRAVENOUS | Status: DC

## 2024-06-20 MED ORDER — PROPOFOL 500 MG/50ML IV EMUL: INTRAVENOUS | Status: DC | PRN

## 2024-06-20 MED ORDER — FENTANYL CITRATE (PF) 100 MCG/2ML IJ SOLN
INTRAMUSCULAR | Status: DC | PRN
  Administered 2024-06-20 (×2): 50 ug via INTRAVENOUS

## 2024-06-20 MED ORDER — MEPIVACAINE HCL (PF) 2 % IJ SOLN
INTRAMUSCULAR | Status: DC | PRN
  Administered 2024-06-20: 3.2 mL via INTRATHECAL

## 2024-06-20 MED ORDER — OXYCODONE-ACETAMINOPHEN 5-325 MG PO TABS: 1.0000 | ORAL_TABLET | ORAL | 0 refills | Status: AC | PRN

## 2024-06-20 MED ORDER — PROPOFOL 1000 MG/100ML IV EMUL
INTRAVENOUS | Status: AC
  Filled 2024-06-20: qty 100

## 2024-06-20 MED ORDER — FENTANYL CITRATE (PF) 100 MCG/2ML IJ SOLN
INTRAMUSCULAR | Status: AC
  Filled 2024-06-20: qty 2

## 2024-06-20 MED ORDER — PHENYLEPHRINE HCL (PRESSORS) 10 MG/ML IV SOLN
INTRAVENOUS | Status: DC | PRN
  Administered 2024-06-20 (×2): 160 ug via INTRAVENOUS
  Administered 2024-06-20: 240 ug via INTRAVENOUS

## 2024-06-20 MED ORDER — TRANEXAMIC ACID-NACL 1000-0.7 MG/100ML-% IV SOLN
INTRAVENOUS | Status: AC
  Filled 2024-06-20: qty 100

## 2024-06-20 MED ORDER — CHLORHEXIDINE GLUCONATE 0.12 % MT SOLN
15.0000 mL | Freq: Once | OROMUCOSAL | Status: AC
  Administered 2024-06-20: 15 mL via OROMUCOSAL

## 2024-06-20 MED ORDER — ONDANSETRON HCL 4 MG/2ML IJ SOLN: INTRAMUSCULAR | Status: DC | PRN

## 2024-06-20 MED ORDER — MIDAZOLAM HCL 2 MG/2ML IJ SOLN
INTRAMUSCULAR | Status: AC
  Filled 2024-06-20: qty 2

## 2024-06-20 MED ORDER — ORAL CARE MOUTH RINSE: 15.0000 mL | Freq: Once | OROMUCOSAL | Status: AC

## 2024-06-20 MED ORDER — CEFAZOLIN SODIUM-DEXTROSE 2-4 GM/100ML-% IV SOLN
2.0000 g | INTRAVENOUS | Status: AC
  Administered 2024-06-20: 2 g via INTRAVENOUS
  Filled 2024-06-20: qty 100

## 2024-06-20 MED ORDER — PROPOFOL 10 MG/ML IV BOLUS
INTRAVENOUS | Status: DC | PRN
  Administered 2024-06-20: 40 mg via INTRAVENOUS
  Administered 2024-06-20: 20 mg via INTRAVENOUS

## 2024-06-20 MED ORDER — POVIDONE-IODINE 10 % EX SWAB
2.0000 | Freq: Once | CUTANEOUS | Status: AC
  Administered 2024-06-20: 2 via TOPICAL

## 2024-06-20 MED ORDER — OXYCODONE HCL 5 MG PO TABS: 5.0000 mg | ORAL_TABLET | Freq: Once | ORAL | Status: DC | PRN

## 2024-06-20 MED ORDER — SODIUM CHLORIDE (PF) 0.9 % IJ SOLN
INTRAMUSCULAR | Status: DC | PRN
  Administered 2024-06-20: 100 mL

## 2024-06-20 MED ORDER — HYDROMORPHONE HCL 1 MG/ML IJ SOLN
0.5000 mg | INTRAMUSCULAR | Status: DC | PRN
  Administered 2024-06-20: 1 mg via INTRAVENOUS

## 2024-06-20 MED ADMIN — Ondansetron HCl Inj 4 MG/2ML (2 MG/ML): 4 mg | INTRAVENOUS | NDC 00409475518

## 2024-06-20 SURGICAL SUPPLY — 50 items
ATTUNE MED DOME PAT 38 KNEE (Knees) IMPLANT
ATTUNE PS FEM RT SZ 6 CEM KNEE (Femur) IMPLANT
ATTUNE PSRP INSR SZ6 8 KNEE (Insert) IMPLANT
BAG COUNTER SPONGE SURGICOUNT (BAG) IMPLANT
BAG ZIPLOCK 12X15 (MISCELLANEOUS) ×1 IMPLANT
BASE TIBIAL ROT PLAT SZ 7 KNEE (Knees) IMPLANT
BLADE SAGITTAL 25.0X1.19X90 (BLADE) ×1 IMPLANT
BLADE SAW SGTL 13.0X1.19X90.0M (BLADE) ×1 IMPLANT
BNDG ELASTIC 6INX 5YD STR LF (GAUZE/BANDAGES/DRESSINGS) IMPLANT
BNDG ELASTIC 6X10 VLCR STRL LF (GAUZE/BANDAGES/DRESSINGS) IMPLANT
BOOTIES KNEE HIGH SLOAN (MISCELLANEOUS) IMPLANT
BOWL SMART MIX CTS (DISPOSABLE) ×1 IMPLANT
CEMENT HV SMART SET (Cement) ×2 IMPLANT
COVER SURGICAL LIGHT HANDLE (MISCELLANEOUS) ×1 IMPLANT
CUFF TRNQT CYL 34X4.125X (TOURNIQUET CUFF) ×1 IMPLANT
DERMABOND ADVANCED .7 DNX12 (GAUZE/BANDAGES/DRESSINGS) ×1 IMPLANT
DRAPE U-SHAPE 47X51 STRL (DRAPES) ×1 IMPLANT
DRESSING AQUACEL AG SP 3.5X10 (GAUZE/BANDAGES/DRESSINGS) IMPLANT
DRSG AQUACEL AG ADV 3.5X10 (GAUZE/BANDAGES/DRESSINGS) ×1 IMPLANT
DURAPREP 26ML APPLICATOR (WOUND CARE) ×1 IMPLANT
ELECT PENCIL ROCKER SW 15FT (MISCELLANEOUS) ×1 IMPLANT
ELECT REM PT RETURN 15FT ADLT (MISCELLANEOUS) ×1 IMPLANT
GLOVE BIO SURGEON STRL SZ8.5 (GLOVE) IMPLANT
GLOVE BIOGEL PI IND STRL 8 (GLOVE) ×1 IMPLANT
GLOVE BIOGEL PI IND STRL 9 (GLOVE) IMPLANT
GLOVE ECLIPSE 7.5 STRL STRAW (GLOVE) ×1 IMPLANT
GOWN STRL REUS W/ TWL XL LVL3 (GOWN DISPOSABLE) ×2 IMPLANT
HOLDER FOLEY CATH W/STRAP (MISCELLANEOUS) IMPLANT
HOOD PEEL AWAY T7 (MISCELLANEOUS) ×3 IMPLANT
KIT TURNOVER KIT A (KITS) ×1 IMPLANT
MANIFOLD NEPTUNE II (INSTRUMENTS) ×1 IMPLANT
NDL HYPO 22X1.5 SAFETY MO (MISCELLANEOUS) ×2 IMPLANT
NEEDLE HYPO 22X1.5 SAFETY MO (MISCELLANEOUS) ×2 IMPLANT
PACK TOTAL KNEE CUSTOM (KITS) ×1 IMPLANT
PADDING CAST COTTON 6X4 STRL (CAST SUPPLIES) ×1 IMPLANT
PIN STEINMAN FIXATION KNEE (PIN) IMPLANT
PROTECTOR NERVE ULNAR (MISCELLANEOUS) ×1 IMPLANT
SET HNDPC FAN SPRY TIP SCT (DISPOSABLE) ×1 IMPLANT
SPIKE FLUID TRANSFER (MISCELLANEOUS) ×2 IMPLANT
SUT MNCRL AB 3-0 PS2 18 (SUTURE) IMPLANT
SUT VIC AB 0 CT1 36 (SUTURE) ×2 IMPLANT
SUT VIC AB 1 CT1 36 (SUTURE) ×2 IMPLANT
SUT VIC AB 2-0 CT1 TAPERPNT 27 (SUTURE) ×1 IMPLANT
SUT VICRYL+ 3-0 36IN CT-1 (SUTURE) IMPLANT
SYR CONTROL 10ML LL (SYRINGE) ×2 IMPLANT
TRAY CATH INTERMITTENT SS 16FR (CATHETERS) IMPLANT
TRAY FOLEY MTR SLVR 16FR STAT (SET/KITS/TRAYS/PACK) IMPLANT
TUBE SUCTION HIGH CAP CLEAR NV (SUCTIONS) ×1 IMPLANT
WATER STERILE IRR 1000ML POUR (IV SOLUTION) ×2 IMPLANT
WRAP KNEE MAXI GEL POST OP (GAUZE/BANDAGES/DRESSINGS) ×1 IMPLANT

## 2024-06-20 NOTE — Discharge Instructions (Addendum)

## 2024-06-20 NOTE — Progress Notes (Signed)
 Orthopedic Tech Progress Note Patient Details:  Hunter Krause 30-Apr-1961 [id number] Applied bone foam per order.  Ortho Devices Type of Ortho Device: Bone foam zero knee Ortho Device/Splint Location: RLE Ortho Device/Splint Interventions: Ordered, Application, Adjustment   Post Interventions Patient Tolerated: Well Instructions Provided: Adjustment of device, Care of device, Poper ambulation with device  [name] 06/20/2024, 9:21 AM

## 2024-06-20 NOTE — Anesthesia Postprocedure Evaluation (Signed)
"   Anesthesia Post Note  Patient: NALIN MAZZOCCO  Procedure(s) Performed: ARTHROPLASTY, KNEE, TOTAL (Right: Knee)     Patient location during evaluation: PACU Anesthesia Type: Regional, MAC and Spinal Level of consciousness: awake and alert and oriented Pain management: pain level controlled Vital Signs Assessment: post-procedure vital signs reviewed and stable Respiratory status: spontaneous breathing, nonlabored ventilation and respiratory function stable Cardiovascular status: blood pressure returned to baseline and stable Postop Assessment: no headache, no backache, spinal receding and patient able to bend at knees Anesthetic complications: no   No notable events documented.  Last Vitals:  Vitals:   06/20/24 1030 06/20/24 1045  BP: 137/82 (!) 134/93  Pulse: 63 75  Resp: 12 12  Temp:  36.5 C  SpO2: 97% 98%    Last Pain:  Vitals:   06/20/24 1142  TempSrc:   PainSc: 8                  Almarie HERO Shree Espey      "

## 2024-06-20 NOTE — Anesthesia Procedure Notes (Signed)
 Anesthesia Regional Block: Adductor canal block   Pre-Anesthetic Checklist: , timeout performed,  Correct Patient, Correct Site, Correct Laterality,  Correct Procedure, Correct Position, site marked,  Risks and benefits discussed,  Surgical consent,  Pre-op evaluation,  At surgeon's request and post-op pain management  Laterality: Right  Prep: Maximum Sterile Barrier Precautions used, chloraprep       Needles:  Injection technique: Single-shot  Needle Type: Echogenic Stimulator Needle     Needle Length: 9cm  Needle Gauge: 22     Additional Needles:   Procedures:,,,, ultrasound used (permanent image in chart),,    Narrative:  Start time: 06/20/2024 7:00 AM End time: 06/20/2024 7:05 AM Injection made incrementally with aspirations every 5 mL.  Performed by: Personally  Anesthesiologist: Merla Almarie HERO, DO  Additional Notes: Monitors applied. No increased pain on injection. No increased resistance to injection. Injection made in 5cc increments. Good needle visualization. Patient tolerated procedure well.

## 2024-06-20 NOTE — Evaluation (Signed)
 " Physical Therapy Evaluation Patient Details Name: Hunter Krause MRN: [id number] DOB: 07/15/1960 Today's Date: 06/20/2024  History of Present Illness  63 yo male presents to therapy s/p R TKA on 06/20/2024 due to failure of conservative measures. Pt PMH includes but is not limited to: arthritis, CAD, kidney ca, DM II, GERD, GI bleed, gout, hernia, HTN, MI, cervical neck and lumbar fusion.  Clinical Impression      Hunter Krause is a 63 y.o. male POD 0 s/p R TKA. Patient reports IND with mobility at baseline. Patient is now limited by functional impairments (see PT problem list below) and requires CGA for transfers and gait with RW. Patient was able to ambulate 65 feet x 2 with RW and CGA and cues for safe walker management. Patient educated on safe sequencing for stair mobility, fall risk prevention, focus on R knee extension, use of CP/ice, pain management and car transfers pt  and spouse verbalized understanding of  safe guarding position for people assisting with mobility. Patient instructed in exercises to facilitate ROM and circulation reviewed and HO provided. Patient will benefit from continued skilled PT interventions to address impairments and progress towards PLOF. Patient has met mobility goals at adequate level for discharge home with family support and [facility] services; will continue to follow if pt continues acute stay to progress towards Mod I goals.     If plan is discharge home, recommend the following: A little help with walking and/or transfers;A little help with bathing/dressing/bathroom;Assistance with cooking/housework;Assist for transportation;Help with stairs or ramp for entrance   Can travel by private vehicle        Equipment Recommendations None recommended by PT  Recommendations for Other Services       Functional Status Assessment Patient has had a recent decline in their functional status and demonstrates the ability to make significant improvements in  function in a reasonable and predictable amount of time.     Precautions / Restrictions Precautions Precautions: Knee;Fall Restrictions Weight Bearing Restrictions Per Provider Order: No      Mobility  Bed Mobility Overal bed mobility: Needs Assistance Bed Mobility: Supine to Sit     Supine to sit: Supervision     General bed mobility comments: min cues    Transfers Overall transfer level: Needs assistance Equipment used: Rolling walker (2 wheels) Transfers: Sit to/from Stand Sit to Stand: Contact guard assist           General transfer comment: min cues    Ambulation/Gait Ambulation/Gait assistance: Contact guard assist Gait Distance (Feet): 65 Feet Assistive device: Rolling walker (2 wheels) Gait Pattern/deviations: Step-to pattern, Decreased stance time - right, Antalgic, Trunk flexed Gait velocity: decreased     General Gait Details: slight trunk flexion with B UE support at RW to offload R LE in stance phase, min cues for RW management and safety  Stairs Stairs: Yes Stairs assistance: Contact guard assist Stair Management: Two rails Number of Stairs: 3 General stair comments: min cues for safety, sequencing and step to pattern  Wheelchair Mobility     Tilt Bed    Modified Rankin (Stroke Patients Only)       Balance Overall balance assessment: Needs assistance Sitting-balance support: Feet supported Sitting balance-Leahy Scale: Good     Standing balance support: Bilateral upper extremity supported, During functional activity, Reliant on assistive device for balance Standing balance-Leahy Scale: Fair Standing balance comment: static standing no UE support  Pertinent Vitals/Pain Pain Assessment Pain Assessment: 0-10 Pain Score: 8  Pain Location: R knee and LE Pain Descriptors / Indicators: Aching, Constant, Discomfort, Dull, Grimacing, Operative site guarding    Home Living Family/patient expects  to be discharged to:: Private residence Living Arrangements: Spouse/significant other Available Help at Discharge: Family Type of Home: House Home Access: Stairs to enter (front of home and pt enters through basement  with full flight of stairs and L handrail) Entrance Stairs-Rails: Right;Left;Can reach both Entrance Stairs-Number of Steps: 7   Home Layout: Multi-level;Laundry or work area in [employer] Equipment: Agricultural Consultant (2 wheels);Cane - single point (trecking sticks)      Prior Function Prior Level of Function : Independent/Modified Independent;Driving             Mobility Comments: IND no AD for all ADLs, self care tasks and IADLs       Extremity/Trunk Assessment        Lower Extremity Assessment Lower Extremity Assessment: RLE deficits/detail RLE Deficits / Details: ankle DF/PF 5/5 and SLR < 10 degree lag RLE Sensation: WNL    Cervical / Trunk Assessment Cervical / Trunk Assessment: Neck Surgery;Back Surgery  Communication   Communication Communication: No apparent difficulties    Cognition Arousal: Alert Behavior During Therapy: WFL for tasks assessed/performed   PT - Cognitive impairments: No apparent impairments                         Following commands: Intact       Cueing       General Comments      Exercises Total Joint Exercises Ankle Circles/Pumps: AROM, Both, 15 reps Quad Sets: AROM, Right, 5 reps Short Arc Quad: AROM, Right, 5 reps Heel Slides: AROM, Right, 5 reps Hip ABduction/ADduction: AROM, Right, 5 reps Straight Leg Raises: AROM, Right, 5 reps Knee Flexion: AROM, Right, 5 reps, Seated   Assessment/Plan    PT Assessment Patient needs continued PT services  PT Problem List Decreased range of motion;Decreased strength;Decreased activity tolerance;Decreased balance;Decreased mobility;Decreased coordination;Pain       PT Treatment Interventions DME instruction;Gait training;Stair training;Functional mobility  training;Therapeutic activities;Therapeutic exercise;Balance training;Neuromuscular re-education;Patient/family education;Modalities    PT Goals (Current goals can be found in the Care Plan section)  Acute Rehab PT Goals 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 PT Goal Formulation: With patient Time For Goal Achievement: 07/11/24 Potential to Achieve Goals: Good    Frequency 7X/week     Co-evaluation               AM-PAC PT 6 Clicks Mobility  Outcome Measure Help needed turning from your back to your side while in a flat bed without using bedrails?: None Help needed moving from lying on your back to sitting on the side of a flat bed without using bedrails?: A Little Help needed moving to and from a bed to a chair (including a wheelchair)?: A Little Help needed standing up from a chair using your arms (e.g., wheelchair or bedside chair)?: A Little Help needed to walk in hospital room?: A Little Help needed climbing 3-5 steps with a railing? : A Little 6 Click Score: 19    End of Session Equipment Utilized During Treatment: Gait belt Activity Tolerance: No increased pain;Patient tolerated treatment well Patient left: in chair;with call bell/phone within reach;with family/visitor present Nurse Communication: Mobility status;Other (comment) (pt readiness for same day d/c from PT standpoint) PT  Visit Diagnosis: Unsteadiness on feet (R26.81);Other abnormalities of gait and mobility (R26.89);Muscle weakness (generalized) (M62.81);Pain Pain - Right/Left: Right Pain - part of body: Leg;Knee    Time: 1133-1226 PT Time Calculation (min) (ACUTE ONLY): 53 min   Charges:   PT Evaluation $PT Eval Low Complexity: 1 Low PT Treatments $Gait Training: 8-22 mins $Therapeutic Exercise: 8-22 mins $Therapeutic Activity: 8-22 mins PT General Charges $$ ACUTE PT VISIT: 1 Visit         [place], PT Acute Rehab   [place] VEAR Drone 06/20/2024, 7:23  PM "

## 2024-06-20 NOTE — Transfer of Care (Signed)
 Immediate Anesthesia Transfer of Care Note  Patient: Hunter Krause  Procedure(s) Performed: ARTHROPLASTY, KNEE, TOTAL (Right: Knee)  Patient Location: PACU  Anesthesia Type:MAC and Spinal  Level of Consciousness: awake and alert   Airway & Oxygen Therapy: Patient Spontanous Breathing and Patient connected to face mask oxygen  Post-op Assessment: Report given to RN and Post -op Vital signs reviewed and stable  Post vital signs: Reviewed and stable  Last Vitals:  Vitals Value Taken Time  BP 105/90 06/20/24 09:08  Temp    Pulse 74 06/20/24 09:10  Resp 12 06/20/24 09:10  SpO2 99 % 06/20/24 09:10  Vitals shown include unfiled device data.  Last Pain:  Vitals:   06/20/24 0636  TempSrc: Oral  PainSc:          Complications: No notable events documented.

## 2024-06-20 NOTE — Op Note (Signed)
 PATIENT ID:      Hunter Krause  MRN:     [id number] DOB/AGE:    11/01/1960 / 63 y.o.       OPERATIVE REPORT   DATE OF PROCEDURE:  06/20/2024      PREOPERATIVE DIAGNOSIS:   RIGHT KNEE OSTEOARTHRITIS      Estimated body mass index is 31.75 kg/m as calculated from the following:   Height as of 06/13/24: 5' 9 (1.753 m).   Weight as of this encounter: 97.5 kg.                                                       POSTOPERATIVE DIAGNOSIS:   Same                                                                  PROCEDURE:  Procedures: ARTHROPLASTY, KNEE, TOTAL Using DepuyAttune RP implants #6 Femur, #7Tibia, 5 mm Attune RP bearing, 38 Patella    SURGEON: [name]  ASSISTANT:   Camellia POUR. [employer]   (Present and scrubbed throughout the case, critical for assistance with exposure, retraction, instrumentation, and closure.)        ANESTHESIA: spinal, 20cc Exparel , 50cc 0.25% Marcaine  EBL: min cc FLUID REPLACEMENT: unk cc crystaloid TOURNIQUET: none DRAINS: None TRANEXAMIC ACID : 1gm IV, 2gm topical COMPLICATIONS:  None         INDICATIONS FOR PROCEDURE: The patient has  RIGHT KNEE OSTEOARTHRITIS, mild varus deformities, XR shows bone on bone arthritis, lateral subluxation of tibia. Patient has failed all conservative measures including anti-inflammatory medicines, narcotics, attempts at exercise and weight loss, cortisone injections and viscosupplementation.  Risks and benefits of surgery have been discussed, questions answered.   DESCRIPTION OF PROCEDURE: The patient identified by armband, received  IV antibiotics, in the holding area at [facility]. Patient taken to the operating room, appropriate anesthetic monitors were attached, and spinal anesthesia was  induced. IV Tranexamic acid  was given. Lateral post and 2 surefoot positioners applied to the table, the lower extremity was then prepped and draped in usual sterile fashion from the toes to the high thigh. Time-out  procedure was performed. Camellia POUR. [facility] PAC, was present and scrubbed throughout the case, critical for assistance with, positioning, exposure, retraction, instrumentation, and closure.The skin and subcutaneous tissue along the incision was injected with 20 cc of a mixture of 20cc Exparel  and 30cc Marcaine  50cc saline solution, using a 21-gauge by 1-1/2 inch needle. We began the operation, with the knee flexed 130 degrees, by making the anterior midline incision starting at handbreadth above the patella going over the patella 1 cm medial to and 4 cm distal to the tibial tubercle. Small bleeders in the skin and the subcutaneous tissue identified and cauterized. Transverse retinaculum was incised and reflected medially and a medial parapatellar arthrotomy was accomplished. the patella was everted and theprepatellar fat pad resected. The superficial medial collateral ligament was then elevated from anterior to posterior along the proximal flare of the tibia and anterior half of the menisci resected. The knee was hyperflexed exposing bone on bone arthritis. Peripheral  and notch osteophytes as well as the cruciate ligaments were then resected. We continued to work our way around posteriorly along the proximal tibia, and externally rotated the tibia subluxing it out from underneath the femur. A McHale PCL retractor was placed through the notch, a lateral Hohmann retractor, and anterolateral small homan retractor placed. We then entered the proximal tibia with the Depuy starter drill in line with the axis of the tibia followed by an intramedullary guide rod and 3 degree posterior slope cutting guide. The tibial cutting guide, was pinned into place allowing resection of 2 mm of bone medially and 10 mm of bone laterally. Satisfied with the tibial resection, we then entered the distal femur 2 mm anterior to the PCL origin with the starter drill, followed by the intramedullary guide rod and applied the distal femoral cutting  guide set at 9 mm, with 5 degrees of valgus. This was pinned along the epicondylar axis. At this point, the distal femoral cut was accomplished without difficulty. We then sized for a #6 femoral component and pinned the chamfer guide in 3 degrees of external rotation. The anterior, posterior, and chamfer cuts were accomplished without difficulty followed by the Attune RP box cutting guide and the box cut. We also removed posterior osteophytes from the posterior femoral condyles. The posterior capsule was injected with Exparel  solution. The knee was brought into full extension. We checked our extension gap and fit a 5 mm trial lollipop. Distracting in extension with a lamina spreader,  bleeders in the posterior capsule, Posterior medial and posterior lateral gutter were cauterized.  The transexamic acid-soaked sponge was then placed in the gap of the knee in extension. The knee was flexed 30. The posterior patella cut was accomplished with the 9.5 mm Attune cutting guide, sized for a 38 mm dome, and the fixation pegs drilled.The knee was then once again hyperflexed exposing the proximal tibia. We sized for a # 7 tibial base plate, applied the smokestack and the conical reamer followed by the the Delta fin keel punch. We then hammered into place the Attune RP trial femoral component, drilled the lugs, inserted a  5 mm trial bearing, trial patellar button, and took the knee through range of motion from 0-130 degrees. Medial and lateral ligamentous stability was checked. No thumb pressure was required for patellar Tracking.  All trial components were removed, mating surfaces irrigated with pulse lavage, and dried with suction and sponges. Exparel  solution was applied to the cancellus bone of the patella distal femur and proximal tibia.  After waiting 30 seconds, the bony surfaces were again, dried with sponges. A double batch of DePuy HV cement was mixed and applied to all bony metallic mating surfaces except for the  posterior condyles of the femur itself. In order, we hammered into place the tibial tray and removed excess cement, the femoral component and removed excess cement. The final Attune RP bearing was inserted, and the knee brought to full extension with compression. The patellar button was clamped into place, and excess cement removed. The knee was held at 30 flexion with compression using the second surefoot, while the cement cured. The wound was irrigated out with normal saline solution pulse lavage. The rest of the Exparel  was injected into the parapatellar arthrotomy, subcutaneous tissues, and periosteal tissues. The parapatellar arthrotomy was closed with running #1 Vicryl suture. The subcutaneous tissue with 3-0 undyed Vicryl suture, and the skin with running 3-0 SQ vicryl. An Aquacil dressing and Ace wrap were applied. The  patient was taken to recovery room without difficulty.   [name] 06/20/2024, 3:33 PM

## 2024-06-20 NOTE — Anesthesia Procedure Notes (Signed)
 Spinal  Patient location during procedure: OR Start time: 06/20/2024 7:17 AM End time: 06/20/2024 7:20 AM Reason for block: surgical anesthesia  Staffing Performed: anesthesiologist  Authorized by: Merla Almarie HERO, DO   Performed by: Merla Almarie HERO, DO  Preanesthetic Checklist Completed: patient identified, IV checked, risks and benefits discussed, surgical consent, monitors and equipment checked, pre-op evaluation and timeout performed Spinal Block Patient position: sitting Prep: DuraPrep and site prepped and draped Patient monitoring: cardiac monitor, continuous pulse ox and blood pressure Approach: midline Location: L3-4 Injection technique: single-shot Needle Needle type: Pencan  Needle gauge: 24 G Needle length: 9 cm Assessment Sensory level: T6 Events: CSF return  Additional Notes Functioning IV was confirmed and monitors were applied. Sterile prep and drape, including hand hygiene and sterile gloves were used. The patient was positioned and the spine was prepped. The skin was anesthetized with lidocaine .  Free flow of clear CSF was obtained prior to injecting local anesthetic into the CSF.  The spinal needle aspirated freely following injection.  The needle was carefully withdrawn.  The patient tolerated the procedure well.

## 2024-06-20 NOTE — Interval H&P Note (Signed)
 History and Physical Interval Note:  06/20/2024 7:10 AM  Hunter Krause  has presented today for surgery, with the diagnosis of RIGHT KNEE OSTEOARTHRITIS.  The various methods of treatment have been discussed with the patient and family. After consideration of risks, benefits and other options for treatment, the patient has consented to  Procedures: ARTHROPLASTY, KNEE, TOTAL (Right) as a surgical intervention.  The patient's history has been reviewed, patient examined, no change in status, stable for surgery.  I have reviewed the patient's chart and labs.  Questions were answered to the patient's satisfaction.     [name]

## 2024-06-21 ENCOUNTER — Encounter (HOSPITAL_COMMUNITY): Payer: Self-pay | Admitting: Orthopedic Surgery

## 2024-06-22 ENCOUNTER — Ambulatory Visit: Admitting: Physical Therapy

## 2024-06-22 ENCOUNTER — Encounter: Payer: Self-pay | Admitting: Physical Therapy

## 2024-06-22 ENCOUNTER — Other Ambulatory Visit: Payer: Self-pay

## 2024-06-22 DIAGNOSIS — R600 Localized edema: Secondary | ICD-10-CM | POA: Insufficient documentation

## 2024-06-22 DIAGNOSIS — M25561 Pain in right knee: Secondary | ICD-10-CM | POA: Insufficient documentation

## 2024-06-22 DIAGNOSIS — M6281 Muscle weakness (generalized): Secondary | ICD-10-CM | POA: Diagnosis present

## 2024-06-22 DIAGNOSIS — R2689 Other abnormalities of gait and mobility: Secondary | ICD-10-CM | POA: Insufficient documentation

## 2024-06-22 NOTE — Therapy (Signed)
 " OUTPATIENT PHYSICAL THERAPY LOWER EXTREMITY EVALUATION   Patient Name: Hunter Krause MRN: [id number] DOB:Aug 22, 1960, 63 y.o., male Today's Date: 06/22/2024  END OF SESSION:  PT End of Session - 06/22/24 1316     Visit Number 1    Number of Visits 17    Date for Recertification  08/17/24    Authorization Type BCBS    PT Start Time 1317    PT Stop Time 1355    PT Time Calculation (min) 38 min          Past Medical History:  Diagnosis Date   Arthritis    Asthma 1994   allergy induced asthma   Cancer (HCC)    kidney lesion   Coronary artery disease    Diabetes mellitus without complication (HCC) type 2    GERD (gastroesophageal reflux disease)    GI bleed 12/01/2021   [facility]   GI bleed 12/01/2021   [facility]   Gout    no recent flares   History of hiatal hernia    History of kidney stones    Hypertension    treated with hydrocholothiazide   Myocardial infarction (HCC) 2019   Pneumonia    Sleep apnea    treated with surgery. No problems now.   Past Surgical History:  Procedure Laterality Date   ARTHROSCOPY KNEE W/ DRILLING  2004   right knee   Blephroplasty  upper     cervical neck fusion     C 3 to C 5 has plates and screws   CHOLECYSTECTOMY     CORONARY ANGIOPLASTY  2019   stent x 1   CORONARY STENT INTERVENTION     CYSTOSCOPY/URETEROSCOPY/HOLMIUM LASER/STENT PLACEMENT Left 09/10/2020   Procedure: CYSTOSCOPY LEFT URETEROSCOPY/HOLMIUM LASER/STENT PLACEMENT;  Surgeon: Carolee Sherwood JONETTA DOUGLAS, MD;  Location: [facility];  Service: Urology;  Laterality: Left;   CYSTOSCOPY/URETEROSCOPY/HOLMIUM LASER/STENT PLACEMENT Bilateral 05/28/2022   Procedure: CYSTOSCOPY/ RETROGRADE/URETEROSCOPY/HOLMIUM LASER/STENT PLACEMENT;  Surgeon: [name], MD;  Location: [facility];  Service: Urology;  Laterality: Bilateral;   EXTRACORPOREAL SHOCK WAVE LITHOTRIPSY Left 09/03/2020   Procedure: LEFT EXTRACORPOREAL SHOCK WAVE LITHOTRIPSY  (ESWL);  Surgeon: Carolee Sherwood JONETTA DOUGLAS, MD;  Location: [facility];  Service: Urology;  Laterality: Left;   IR RADIOLOGIST EVAL & MGMT  09/23/2023   IR RADIOLOGIST EVAL & MGMT  12/22/2023   IR RADIOLOGIST EVAL & MGMT  03/17/2024   LITHOTRIPSY  2001   lower back  2020   fusion    X2    L 4 to L 6   L2 L3   NASAL SEPTUM SURGERY  2005   palate remove for osa   RADIOLOGY WITH ANESTHESIA Right 12/02/2023   Procedure: CT WITH ANESTHESIA;  Surgeon: Radiologist, Medication, MD;  Location: [facility];  Service: Radiology;  Laterality: Right;   TOTAL KNEE ARTHROPLASTY Right 06/20/2024   Procedure: ARTHROPLASTY, KNEE, TOTAL;  Surgeon: Yvone Rush, MD;  Location: [facility];  Service: Orthopedics;  Laterality: Right;   Patient Active Problem List   Diagnosis Date Noted   Osteoarthritis of right knee 06/19/2024    PCP: [name], MD   REFERRING PROVIDER: Yvone Rush, MD   REFERRING DIAG: [phone] (ICD-10-CM) - Presence of right artificial knee joint   THERAPY DIAG:  Right knee pain, unspecified chronicity  Localized edema  Muscle weakness (generalized)  Other abnormalities of gait and mobility  Rationale for Evaluation and Treatment: Rehabilitation  ONSET DATE: R TKA 06/20/24  SUBJECTIVE:   SUBJECTIVE STATEMENT: Pt endorses hx of chronic knee pain, 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 Since surgery has been receiving assistance from spouse and son for household tasks and lower body dressing. Utilizing RW. Has HEP from hospital. Able to manage steps out of home w/ inc time/difficulty.  Denies calf pain/swelling, no SOB/chest pain, no fevers/chills.    PERTINENT HISTORY: Asthma, DM2, GERD, GIB, gout, hx MI, HTN Hx lumbar/cervical fusions  PAIN:  Are you having pain: 7/10 Location/description: R knee  Best-worst over past week: 4-9/10  - aggravating factors: moving knee, WB, lower body dressing - Easing factors: ice, medication    PRECAUTIONS:  fall  RED FLAGS: None   WEIGHT BEARING RESTRICTIONS: No  FALLS:  Has patient fallen in last 6 months? 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  DIAGNOSTIC FINDINGS:  R TKA DOS 06/20/24   PATIENT SURVEYS:  LEFS: 11/80   COGNITION: Overall cognitive status: Within functional limits for tasks assessed     SENSATION: Does not endorse any sensory complaints  EDEMA/INSPECTION:  Incision obscured by bandage which is well-saturated in upper portion (approx 40-50%), no significant erythema, mild bruising about knee as expected ; edema WNL ; no calf pain or swelling, no TTP about calf or pain with DF   LOWER EXTREMITY ROM:      Right eval Left eval  Hip flexion    Hip extension    Hip internal rotation    Hip external rotation    Knee extension    Knee flexion AA: 69 deg * P: 80 deg *   (Blank rows = not tested) (Key: WFL = within functional limits not formally assessed, * = concordant pain, s = stiffness/stretching sensation, NT = not tested)  Comments:    LOWER EXTREMITY MMT:    MMT Right eval Left eval  Hip flexion    Hip abduction (modified sitting)    Hip internal rotation    Hip external rotation    Knee flexion    Knee extension    Ankle dorsiflexion     (Blank rows = not tested) (Key: WFL = within functional limits not formally assessed, * = concordant pain, s = stiffness/stretching sensation, NT = not tested)  Comments:  deferred on eval given acuity of  surgery   FUNCTIONAL TESTS:  TUG: 24.98sec RW standard chair   GAIT: Distance walked: within clinic Assistive device utilized: Walker - 2 wheeled Level of assistance: Modified independence Comments: step to pattern leading with R  TREATMENT:  [facility] Adult PT Treatment:                                                DATE: 06/22/24 Therapeutic Exercise: Heel slides, quad set + strap, ankle pumps practice reps + HEP handout  Self Care: Education/discussion re: monitoring for post op complications and appropriate action should they occur, communicating w/ provider re: dressing, education re: frequent mobility within tolerance using RW, ankle pumps, appropriate icing/elevating regimen    PATIENT EDUCATION:  Education details: Pt education on PT impairments, prognosis, and POC. Informed consent. Rationale for interventions, safe/appropriate HEP performance, self care as above Person educated: Patient Education method: Explanation, Demonstration, Tactile cues, Verbal cues Education comprehension: verbalized understanding, returned demonstration, verbal cues required, tactile cues required, and needs further education    HOME EXERCISE PROGRAM: Access Code: [email or web address] URL: [email or web address] Date: 06/22/2024 Prepared by: Alm Jenny  Exercises - Seated Quad Set  - 3-4 x daily - 1 sets - 8-10 reps - Seated Heel Slide  - 3-4 x daily - 1 sets - 8-10 reps - Seated Ankle Pumps  - 3-4 x daily - 1 sets - 10-15 reps  ASSESSMENT:  CLINICAL IMPRESSION: Patient is a pleasant 63 y.o. gentleman who was seen today for physical therapy evaluation and treatment for R TKA DOS 06/20/24  He endorses expected post op limitations in ADLs and functional mobility, requiring RW for ambulation. No post op red flags, although upper portion of bandage is observed to be  well-saturated (has been in ACE wrap since surgery) - advised pt to communicate w/ surgeon's office re: wound care/dressing management, he states he will call after our session. He demonstrates anticipated deficits in knee ROM, quad activation, and altered kinematics w/ gait/transfers. TUG is indicative of fall risk. No adverse events, tolerates exam/HEP well overall. Self care education as above. Recommend skilled PT to address aforementioned deficits with aim of improving functional tolerance and reducing pain with typical activities. Pt departs today's session in no acute distress, all voiced concerns/questions addressed appropriately from PT perspective.    OBJECTIVE IMPAIRMENTS: Abnormal gait, decreased activity tolerance, decreased balance, decreased endurance, decreased mobility, difficulty walking, decreased ROM, decreased strength, increased edema, impaired perceived functional ability, impaired flexibility, improper body mechanics, and pain.   ACTIVITY LIMITATIONS: carrying, lifting, bending, sitting, standing, squatting, sleeping, stairs, transfers, bed mobility, and locomotion level  PARTICIPATION LIMITATIONS: meal prep, cleaning, laundry, driving, shopping, community activity, and occupation  PERSONAL FACTORS: Time since onset of injury/illness/exacerbation and 3+ comorbidities: spinal fusions, asthma, DM2, gout, hx MI, HTN are also affecting patient's functional outcome.   REHAB POTENTIAL: Good  CLINICAL DECISION MAKING: Stable/uncomplicated  EVALUATION COMPLEXITY: Low   GOALS:  SHORT TERM GOALS: Target date: 07/20/2024  Pt will demonstrate appropriate understanding and performance of initially prescribed HEP in order to facilitate improved independence with management of symptoms.  Baseline: HEP established   Goal status: INITIAL   2. Pt will report at least 25% improvement in overall pain levels over past week in order to facilitate improved tolerance to typical daily  activities.   Baseline: 4-9/10  Goal status: INITIAL    LONG TERM GOALS: Target date: 08/17/2024   Pt will score 50/80 or greater on LEFS in order to demonstrate improved perception of function due to symptoms (MCID 9 pts) Baseline: 11/80 Goal status: INITIAL  2.  Pt  will demonstrate at least 0-110 degrees of knee AROM on surgical limb in order to facilitate improved tolerance to functional movements such as squatting, walking, and stair navigation.  Baseline: see ROM chart above Goal status: INITIAL  3.  Pt will demonstrate appropriate performance of final prescribed HEP in order to facilitate improved self-management of symptoms post-discharge.   Baseline: initial HEP prescribed   Goal status: INITIAL    4.  Pt will be able to perform TUG in less than or equal to 13 sec with LRAD in order to indicate reduced risk of falling (cutoff score for fall risk 13.5 sec in community dwelling older adults per [facility] et al, 2000)  Baseline: 24.98sec RW  Goal status: INITIAL    5. Pt will report at least 50% decrease in overall pain levels in past week in order to facilitate improved tolerance to basic ADLs/mobility.   Baseline: 4-9/10  Goal status: INITIAL    6. Pt will demonstrate symmetrical knee MMT between surgical and non-surgical limb in order to facilitate improved functional strength and mechanics.  Baseline: deferred on eval given proximity to surgery  Goal status: INITIAL     PLAN:  PT FREQUENCY: 2x/week  PT DURATION: 8 weeks  PLANNED INTERVENTIONS: 97164- PT Re-evaluation, 97750- Physical Performance Testing, 97110-Therapeutic exercises, 97530- Therapeutic activity, [id number]- Neuromuscular re-education, 97535- Self Care, 02859- Manual therapy, [id number]- Gait training, [phone]- Aquatic Therapy, [phone]- Vasopneumatic device, [phone] (1-2 muscles), [phone] (3+ muscles)- Dry Needling, Patient/Family education, Balance training, Stair training, Taping, Joint mobilization, Scar mobilization,  Cryotherapy, and Moist heat  PLAN FOR NEXT SESSION: Review/update HEP PRN. Work on [employer] exercises as appropriate with emphasis on quad activation, knee ext/flex ROM, functional mechanics. Symptom modification strategies as indicated/appropriate. Mindful of MI hx and spinal fusions.   Alm DELENA Jenny PT, DPT 06/22/2024 3:18 PM  "

## 2024-06-27 ENCOUNTER — Ambulatory Visit
Admission: EM | Admit: 2024-06-27 | Discharge: 2024-06-27 | Disposition: A | Discharge status: Home / Self Care | Attending: Family Medicine | Admitting: Family Medicine

## 2024-06-27 ENCOUNTER — Encounter: Payer: Self-pay | Admitting: Physical Therapy

## 2024-06-27 ENCOUNTER — Ambulatory Visit: Admitting: Physical Therapy

## 2024-06-27 DIAGNOSIS — R600 Localized edema: Secondary | ICD-10-CM

## 2024-06-27 DIAGNOSIS — M6281 Muscle weakness (generalized): Secondary | ICD-10-CM

## 2024-06-27 DIAGNOSIS — R2689 Other abnormalities of gait and mobility: Secondary | ICD-10-CM

## 2024-06-27 DIAGNOSIS — Z5189 Encounter for other specified aftercare: Secondary | ICD-10-CM | POA: Diagnosis not present

## 2024-06-27 DIAGNOSIS — M25561 Pain in right knee: Secondary | ICD-10-CM

## 2024-06-27 NOTE — ED Provider Notes (Signed)
 " Hunter Krause    CSN: [id number] Arrival date & time: 06/27/24  1325      History   Chief Complaint Chief Complaint  Patient presents with   Wound Check    Knee surgery 06/20/24    HPI Hunter Krause is a 63 y.o. male.   HPI pleasant 63 year old male presents for wound check of right knee patient is s/p knee surgery of 06/20/2024.  Patient was advised by physical therapy to come to urgent [facility] today to check wound integrity.  Patient wearing postsurgical dressing from 06/20/2024.  PMH significant for CAD, kidney cancer, and T2 DM without complication.  Past Medical History:  Diagnosis Date   Arthritis    Asthma 1994   allergy induced asthma   Cancer (HCC)    kidney lesion   Coronary artery disease    Diabetes mellitus without complication (HCC) type 2    GERD (gastroesophageal reflux disease)    GI bleed 12/01/2021   [facility]   GI bleed 12/01/2021   admit [facility]   Gout    no recent flares   History of hiatal hernia    History of kidney stones    Hypertension    treated with hydrocholothiazide   Myocardial infarction (HCC) 2019   Pneumonia    Sleep apnea    treated with surgery. No problems now.    Patient Active Problem List   Diagnosis Date Noted   Osteoarthritis of right knee 06/19/2024    Past Surgical History:  Procedure Laterality Date   ARTHROSCOPY KNEE W/ DRILLING  2004   right knee   Blephroplasty  upper     cervical neck fusion     C 3 to C 5 has plates and screws   CHOLECYSTECTOMY     CORONARY ANGIOPLASTY  2019   stent x 1   CORONARY STENT INTERVENTION     CYSTOSCOPY/URETEROSCOPY/HOLMIUM LASER/STENT PLACEMENT Left 09/10/2020   Procedure: CYSTOSCOPY LEFT URETEROSCOPY/HOLMIUM LASER/STENT PLACEMENT;  Surgeon: Carolee Sherwood JONETTA DOUGLAS, MD;  Location: [facility];  Service: Urology;  Laterality: Left;   CYSTOSCOPY/URETEROSCOPY/HOLMIUM LASER/STENT PLACEMENT Bilateral 05/28/2022   Procedure: CYSTOSCOPY/  RETROGRADE/URETEROSCOPY/HOLMIUM LASER/STENT PLACEMENT;  Surgeon: [name], MD;  Location: [facility];  Service: Urology;  Laterality: Bilateral;   EXTRACORPOREAL SHOCK WAVE LITHOTRIPSY Left 09/03/2020   Procedure: LEFT EXTRACORPOREAL SHOCK WAVE LITHOTRIPSY (ESWL);  Surgeon: Carolee Sherwood JONETTA DOUGLAS, MD;  Location: [facility];  Service: Urology;  Laterality: Left;   IR RADIOLOGIST EVAL & MGMT  09/23/2023   IR RADIOLOGIST EVAL & MGMT  12/22/2023   IR RADIOLOGIST EVAL & MGMT  03/17/2024   LITHOTRIPSY  2001   lower back  2020   fusion    X2    L 4 to L 6   L2 L3   NASAL SEPTUM SURGERY  2005   palate remove for osa   RADIOLOGY WITH ANESTHESIA Right 12/02/2023   Procedure: CT WITH ANESTHESIA;  Surgeon: Radiologist, Medication, MD;  Location: [facility];  Service: Radiology;  Laterality: Right;   TOTAL KNEE ARTHROPLASTY Right 06/20/2024   Procedure: ARTHROPLASTY, KNEE, TOTAL;  Surgeon: Yvone Rush, MD;  Location: [facility];  Service: Orthopedics;  Laterality: Right;       Home Medications    Prior to Admission medications  Medication Sig Start Date End Date Taking? Authorizing Provider  albuterol (PROVENTIL HFA;VENTOLIN HFA) 108 (90 BASE) MCG/ACT inhaler Inhale 2 puffs into the lungs every 6 (six) hours as needed  for wheezing or shortness of breath.    [provider]  allopurinol (ZYLOPRIM) 300 MG tablet Take 300 mg by mouth at bedtime. 05/07/12   [provider]  aspirin  EC 325 MG tablet Take 1 tablet (325 mg total) by mouth 2 (two) times daily. 06/20/24   Orlando Camellia POUR, PA-C  [Paused] aspirin  EC 81 MG tablet Take 81 mg by mouth every evening. Wait to take this until: July 18, 2024    [provider]  atenolol  (TENORMIN ) 25 MG tablet Take 25 mg by mouth at bedtime.    [provider]  atorvastatin  (LIPITOR) 40 MG tablet Take 40 mg by mouth every evening.    [provider]  calcium  carbonate (TUMS - DOSED IN MG ELEMENTAL  CALCIUM ) 500 MG chewable tablet Chew 1 tablet by mouth 3 (three) times daily as needed for indigestion or heartburn.    [provider]  cetirizine (ZYRTEC) 10 MG chewable tablet Chew 10 mg by mouth daily as needed for allergies.    [provider]  Cholecalciferol (VITAMIN D3 PO) Take 1,000 Units by mouth in the morning.    [provider]  clotrimazole (MYCELEX) 10 MG troche Take 10 mg by mouth 5 (five) times daily as needed (thrush).    [provider]  cyanocobalamin (VITAMIN B12) 1000 MCG tablet Take 1,000 mcg by mouth in the morning.    [provider]  diclofenac Sodium (VOLTAREN) 1 % GEL Apply 2 g topically daily as needed (knee pain).    [provider]  diphenhydrAMINE  (BENADRYL ) 25 mg capsule Take 25 mg by mouth every 6 (six) hours as needed.    [provider]  EPINEPHrine  0.3 mg/0.3 mL IJ SOAJ injection Inject 0.3 mg into the muscle as needed for anaphylaxis.    [provider]  MAGNESIUM PO Take 1 capsule by mouth in the morning.    [provider]  MELATONIN PO Take 12 mg by mouth at bedtime.    [provider]  metFORMIN (GLUCOPHAGE-XR) 500 MG 24 hr tablet Take 1,000 mg by mouth in the morning.    [provider]  nitroGLYCERIN  (NITROSTAT ) 0.4 MG SL tablet Place 0.4 mg under the tongue every 5 (five) minutes x 3 doses as needed for chest pain.    [provider]  oxyCODONE -acetaminophen  (PERCOCET/ROXICET) 5-325 MG tablet Take 1 tablet by mouth every 4 (four) hours as needed for severe pain (pain score 7-10). 06/20/24   Orlando Camellia POUR, PA-C  pantoprazole  (PROTONIX ) 40 MG tablet Take 40 mg by mouth daily as needed (indigestion/heartburn.).    [provider]  polyethylene glycol (MIRALAX / GLYCOLAX) 17 g packet Take 17 g by mouth daily as needed for moderate constipation.    [provider]  pregabalin (LYRICA) 25 MG capsule Take 50 mg by mouth in the morning  and at bedtime. 02/09/24   [provider]  QVAR REDIHALER 80 MCG/ACT inhaler Inhale 1 puff into the lungs 2 (two) times daily.    [provider]  Semaglutide, 1 MG/DOSE, 4 MG/3ML SOPN Inject 1 mg into the skin every Monday. 03/31/24 06/29/24  [provider]  sildenafil (VIAGRA) 50 MG tablet Take 50 mg by mouth daily as needed for erectile dysfunction.    [provider]  tamsulosin  (FLOMAX ) 0.4 MG CAPS capsule Take 0.4 mg by mouth at bedtime.    [provider]  Tezepelumab-ekko 210 MG/1. SOAJ Inject 210 mg into the skin every 28 (  twenty-eight) days. 02/17/23   [provider]  Tiotropium Bromide Monohydrate (SPIRIVA RESPIMAT) 1.25 MCG/ACT AERS Inhale 2 each into the lungs in the morning.    [provider]  tiZANidine  (ZANAFLEX ) 4 MG tablet Take 1 tablet (4 mg total) by mouth at bedtime. 06/20/24   Orlando Camellia POUR, PA-C  zileuton (ZYFLO CR) 600 MG CR tablet Take 600 mg by mouth in the morning and at bedtime. 10/23/16   [provider]  ZINC SULFATE PO Take 1 tablet by mouth in the morning.    [provider]    Family History History reviewed. No pertinent family history.  Social History Social History[1]   Allergies   Fluogen [influenza virus vaccine] and Shrimp (diagnostic)   Review of Systems Review of Systems   Physical Exam Triage Vital Signs ED Triage Vitals [06/27/24 1349]  Encounter Vitals Group     BP      Girls Systolic BP Percentile      Girls Diastolic BP Percentile      Boys Systolic BP Percentile      Boys Diastolic BP Percentile      Pulse      Resp      Temp      Temp src      SpO2      Weight      Height      Head Circumference      Peak Flow      Pain Score 7     Pain Loc      Pain Education      Exclude from Growth Chart    No data found.  Updated Vital Signs BP 136/83 (BP Location: Left Arm)   Pulse 94   Temp 98 F (36.7 C) (Oral)   Resp 18   SpO2 99%    Visual Acuity Right Eye Distance:   Left Eye Distance:   Bilateral Distance:    Right Eye Near:   Left Eye Near:    Bilateral Near:     Physical Exam Vitals and nursing note reviewed.  Constitutional:      Appearance: Normal appearance. He is normal weight.  HENT:     Head: Normocephalic and atraumatic.     Mouth/Throat:     Mouth: Mucous membranes are moist.     Pharynx: Oropharynx is clear.  Eyes:     Extraocular Movements: Extraocular movements intact.     Conjunctiva/sclera: Conjunctivae normal.     Pupils: Pupils are equal, round, and reactive to light.  Cardiovascular:     Heart sounds: Normal heart sounds.  Pulmonary:     Effort: Pulmonary effort is normal.     Breath sounds: Normal breath sounds. No wheezing, rhonchi or rales.  Musculoskeletal:        General: Normal range of motion.  Skin:    General: Skin is warm and dry.     Comments: Right knee: Indurated area of trapped blood beneath surgical bandage, then once removed please see images   Neurological:     General: No focal deficit present.     Mental Status: He is alert and oriented to person, place, and time.  Psychiatric:        Mood and Affect: Mood normal.        Behavior: Behavior normal.      UC Treatments / Results  Labs (all labs ordered are listed, but only abnormal results are displayed) Labs Reviewed - No data to display  EKG  Radiology No results found.  Procedures Procedures (including critical [facility] time)  Medications Ordered in UC Medications - No data to display  Initial Impression / Assessment and Plan / UC Course  I have reviewed the triage vital signs and the nursing notes.  Pertinent labs & imaging results that were available during my [facility] of the patient were reviewed by me and considered in my medical decision making (see chart for details).     MDM: 1.  Visit for wound recheck-wound integrity within normal limits approximated wound borders without signs or  symptoms of infection noted.  Dressing change with Telfa plus barrier island dressing 4 x 6 advised patient to remove dressing after 48 hours and leave open to air to allow healing by secondary intention. Advised patient follow-up with surgeon on his regularly scheduled appointments.  Final Clinical Impressions(s) / UC Diagnoses   Final diagnoses:  Visit for wound check     Discharge Instructions      Advised patient follow-up with surgeon on his regularly scheduled appointments.     ED Prescriptions   None    PDMP not reviewed this encounter.    [1]  Social History Tobacco Use   Smoking status: Never   Smokeless tobacco: Never  Vaping Use   Vaping status: Never Used  Substance Use Topics   Alcohol use: Yes    Comment: rare   Drug use: No     Teddy Sharper, FNP 06/27/24 1422  "

## 2024-06-27 NOTE — Therapy (Signed)
 " OUTPATIENT PHYSICAL THERAPY TREATMENT   Patient Name: Hunter Krause MRN: [id number] DOB:08-24-1960, 63 y.o., male Today's Date: 06/27/2024  END OF SESSION:  PT End of Session - 06/27/24 1313     Visit Number 2    Number of Visits 17    Date for Recertification  08/17/24    Authorization Type [employer]    Authorization Time Period 60VL per year    PT Start Time 1314    PT Stop Time 1322    PT Time Calculation (min) 8 min           Past Medical History:  Diagnosis Date   Arthritis    Asthma 1994   allergy induced asthma   Cancer (HCC)    kidney lesion   Coronary artery disease    Diabetes mellitus without complication (HCC) type 2    GERD (gastroesophageal reflux disease)    GI bleed 12/01/2021   admit to [facility]   GI bleed 12/01/2021   admit to [facility]   Gout    no recent flares   History of hiatal hernia    History of kidney stones    Hypertension    treated with hydrocholothiazide   Myocardial infarction (HCC) 2019   Pneumonia    Sleep apnea    treated with surgery. No problems now.   Past Surgical History:  Procedure Laterality Date   ARTHROSCOPY KNEE W/ DRILLING  2004   right knee   Blephroplasty  upper     cervical neck fusion     C 3 to C 5 has plates and screws   CHOLECYSTECTOMY     CORONARY ANGIOPLASTY  2019   stent x 1   CORONARY STENT INTERVENTION     CYSTOSCOPY/URETEROSCOPY/HOLMIUM LASER/STENT PLACEMENT Left 09/10/2020   Procedure: CYSTOSCOPY LEFT URETEROSCOPY/HOLMIUM LASER/STENT PLACEMENT;  Surgeon: Carolee Sherwood JONETTA DOUGLAS, MD;  Location: [facility];  Service: Urology;  Laterality: Left;   CYSTOSCOPY/URETEROSCOPY/HOLMIUM LASER/STENT PLACEMENT Bilateral 05/28/2022   Procedure: CYSTOSCOPY/ RETROGRADE/URETEROSCOPY/HOLMIUM LASER/STENT PLACEMENT;  Surgeon: [name], MD;  Location: [facility];  Service: Urology;  Laterality: Bilateral;   EXTRACORPOREAL SHOCK WAVE LITHOTRIPSY Left 09/03/2020   Procedure: LEFT EXTRACORPOREAL  SHOCK WAVE LITHOTRIPSY (ESWL);  Surgeon: Carolee Sherwood JONETTA DOUGLAS, MD;  Location: [facility];  Service: Urology;  Laterality: Left;   IR RADIOLOGIST EVAL & MGMT  09/23/2023   IR RADIOLOGIST EVAL & MGMT  12/22/2023   IR RADIOLOGIST EVAL & MGMT  03/17/2024   LITHOTRIPSY  2001   lower back  2020   fusion    X2    L 4 to L 6   L2 L3   NASAL SEPTUM SURGERY  2005   palate remove for osa   RADIOLOGY WITH ANESTHESIA Right 12/02/2023   Procedure: CT WITH ANESTHESIA;  Surgeon: Radiologist, Medication, MD;  Location: [facility];  Service: Radiology;  Laterality: Right;   TOTAL KNEE ARTHROPLASTY Right 06/20/2024   Procedure: ARTHROPLASTY, KNEE, TOTAL;  Surgeon: Yvone Rush, MD;  Location: [facility];  Service: Orthopedics;  Laterality: Right;   Patient Active Problem List   Diagnosis Date Noted   Osteoarthritis of right knee 06/19/2024    PCP: [name], MD   REFERRING PROVIDER: Yvone Rush, MD   REFERRING DIAG: [phone] (ICD-10-CM) - Presence of right artificial knee joint   THERAPY DIAG:  Right knee pain, unspecified chronicity  Localized edema  Muscle weakness (generalized)  Other abnormalities of gait and mobility  Rationale for Evaluation and Treatment:  Rehabilitation  ONSET DATE: R TKA 06/20/24  SUBJECTIVE:  Per eval: Pt endorses hx of chronic knee pain, 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 Since surgery has been receiving assistance from spouse and son for household tasks and lower body dressing. Utilizing RW. Has HEP from hospital. Able to manage steps out of home w/ inc time/difficulty.  Denies calf pain/swelling, no SOB/chest pain, no fevers/chills.   SUBJECTIVE STATEMENT: 06/27/2024: Pt arrives w/ report of modest improvement in pain. No charge visit today - see assessment below    PERTINENT HISTORY: Asthma, DM2, GERD, GIB, gout, hx MI, HTN Hx lumbar/cervical fusions  PAIN:  Are you having pain: unrated  Per eval:   Location/description: R knee  Best-worst over past week: 4-9/10  - aggravating factors: moving knee, WB, lower body dressing - Easing factors: ice, medication    PRECAUTIONS: fall  RED FLAGS: None   WEIGHT BEARING RESTRICTIONS: No  FALLS:  Has patient fallen in last 6 months? 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  DIAGNOSTIC FINDINGS:  R TKA DOS 06/20/24   PATIENT SURVEYS:  LEFS: 11/80   COGNITION: Overall cognitive status: Within functional limits for tasks assessed     SENSATION: Does not endorse any sensory complaints  EDEMA/INSPECTION:  Incision obscured by bandage which is well-saturated in upper portion (approx 40-50%), no significant erythema, mild bruising about knee as expected ; edema WNL ; no calf pain or swelling, no TTP about calf or pain with DF   LOWER EXTREMITY ROM:      Right eval Left eval  Hip flexion    Hip extension    Hip internal rotation    Hip external rotation    Knee extension    Knee flexion AA: 69 deg * P: 80 deg *   (Blank rows = not tested) (Key: WFL = within functional limits not formally assessed, * = concordant pain, s = stiffness/stretching sensation, NT = not tested)  Comments:    LOWER EXTREMITY MMT:    MMT Right eval Left eval  Hip flexion    Hip abduction (modified sitting)    Hip internal rotation    Hip external rotation    Knee flexion    Knee extension    Ankle dorsiflexion     (Blank rows = not tested) (Key: WFL = within  functional limits not formally assessed, * = concordant pain, s = stiffness/stretching sensation, NT = not tested)  Comments:  deferred on eval given acuity of surgery   FUNCTIONAL TESTS:  TUG: 24.98sec RW standard chair   GAIT: Distance walked: within clinic Assistive device utilized: Walker - 2 wheeled Level of assistance: Modified independence Comments: step to pattern leading with R  TREATMENT:   [facility] Adult PT Treatment:                                                DATE: 06/27/24 Arrive no charge visit   [facility] Adult PT Treatment:                                                DATE: 06/22/24 Therapeutic Exercise: Heel slides, quad set + strap, ankle pumps practice reps + HEP handout  Self Care: Education/discussion re: monitoring for post op complications and appropriate action should they occur, communicating w/ provider re: dressing, education re: frequent mobility within tolerance using RW, ankle pumps, appropriate icing/elevating regimen    PATIENT EDUCATION:  Education details: rationale for interventions, HEP  Person educated: Patient Education method: Explanation, Demonstration, Tactile cues, Verbal cues Education comprehension: verbalized understanding, returned demonstration, verbal cues required, tactile cues required, and needs further education     HOME EXERCISE PROGRAM: Access Code: [email or web address] URL: [email or web address] Date: 06/22/2024 Prepared by: Alm Jenny  Exercises - Seated Quad Set  - 3-4 x daily - 1 sets - 8-10 reps - Seated Heel Slide  - 3-4 x daily - 1 sets - 8-10 reps - Seated Ankle Pumps  - 3-4 x daily - 1 sets - 10-15 reps  ASSESSMENT:  CLINICAL IMPRESSION: 06/27/2024: Pt arrives w/ RW, report of modest improvement in pain. He states he has been unable to reach his surgical team despite calling after  initial eval and last Friday. States he called them again this morning and left message with office but states he has not yet heard back. On examination, proximal portion of bandage remains saturated and bleeding appears to have minimally extended distally. He denies any new symptoms or other complicating features. Did discuss with pt that out of abundance of caution it would be best to have incision/dressing assessed further to ensure wound integrity prior to proceeding with PT - in discussion w/ pt he elects to present to urgent care. Communicated with [facility] accordingly. No adverse events, pt departs clinic in no acute distress.   Per eval: Patient is a pleasant 63 y.o. gentleman who was seen today for physical therapy evaluation and treatment for R TKA DOS 06/20/24  He endorses expected post op limitations in ADLs and functional mobility, requiring RW for ambulation. No post op red flags, although upper portion of bandage is observed to be well-saturated (has been in ACE wrap since surgery) - advised pt to communicate w/ surgeon's office re: wound care/dressing management, he states he will call after our session. He demonstrates anticipated deficits in knee ROM, quad activation, and altered kinematics w/ gait/transfers. TUG is indicative of fall risk. No adverse events, tolerates exam/HEP well overall. Self care education as above. Recommend skilled PT to address aforementioned deficits with aim of improving functional tolerance and reducing pain with typical activities. Pt departs today's session in no acute distress, all voiced concerns/questions addressed appropriately from PT perspective.    OBJECTIVE IMPAIRMENTS: Abnormal gait, decreased activity tolerance, decreased balance, decreased endurance, decreased mobility, difficulty walking, decreased ROM, decreased strength, increased edema, impaired perceived functional ability, impaired flexibility, improper body mechanics, and pain.   ACTIVITY  LIMITATIONS: carrying, lifting, bending, sitting,  standing, squatting, sleeping, stairs, transfers, bed mobility, and locomotion level  PARTICIPATION LIMITATIONS: meal prep, cleaning, laundry, driving, shopping, community activity, and occupation  PERSONAL FACTORS: Time since onset of injury/illness/exacerbation and 3+ comorbidities: spinal fusions, asthma, DM2, gout, hx MI, HTN are also affecting patient's functional outcome.   REHAB POTENTIAL: Good  CLINICAL DECISION MAKING: Stable/uncomplicated  EVALUATION COMPLEXITY: Low   GOALS:  SHORT TERM GOALS: Target date: 07/20/2024  Pt will demonstrate appropriate understanding and performance of initially prescribed HEP in order to facilitate improved independence with management of symptoms.  Baseline: HEP established   Goal status: INITIAL   2. Pt will report at least 25% improvement in overall pain levels over past week in order to facilitate improved tolerance to typical daily activities.   Baseline: 4-9/10  Goal status: INITIAL    LONG TERM GOALS: Target date: 08/17/2024   Pt will score 50/80 or greater on LEFS in order to demonstrate improved perception of function due to symptoms (MCID 9 pts) Baseline: 11/80 Goal status: INITIAL  2.  Pt will demonstrate at least 0-110 degrees of knee AROM on surgical limb in order to facilitate improved tolerance to functional movements such as squatting, walking, and stair navigation.  Baseline: see ROM chart above Goal status: INITIAL  3.  Pt will demonstrate appropriate performance of final prescribed HEP in order to facilitate improved self-management of symptoms post-discharge.   Baseline: initial HEP prescribed   Goal status: INITIAL    4.  Pt will be able to perform TUG in less than or equal to 13 sec with LRAD in order to indicate reduced risk of falling (cutoff score for fall risk 13.5 sec in community dwelling older adults per [facility] et al, 2000)  Baseline: 24.98sec RW  Goal  status: INITIAL    5. Pt will report at least 50% decrease in overall pain levels in past week in order to facilitate improved tolerance to basic ADLs/mobility.   Baseline: 4-9/10  Goal status: INITIAL    6. Pt will demonstrate symmetrical knee MMT between surgical and non-surgical limb in order to facilitate improved functional strength and mechanics.  Baseline: deferred on eval given proximity to surgery  Goal status: INITIAL     PLAN:  PT FREQUENCY: 2x/week  PT DURATION: 8 weeks  PLANNED INTERVENTIONS: 97164- PT Re-evaluation, 97750- Physical Performance Testing, 97110-Therapeutic exercises, 97530- Therapeutic activity, [id number]- Neuromuscular re-education, 97535- Self Care, [id number]- Manual therapy, [id number]- Gait training, [phone]- Aquatic Therapy, [phone]- Vasopneumatic device, [phone] (1-2 muscles), [id number] (3+ muscles)- Dry Needling, Patient/Family education, Balance training, Stair training, Taping, Joint mobilization, Scar mobilization, Cryotherapy, and Moist heat  PLAN FOR NEXT SESSION: Review/update HEP PRN. Work on [employer] exercises as appropriate with emphasis on quad activation, knee ext/flex ROM, functional mechanics. Symptom modification strategies as indicated/appropriate. Mindful of MI hx and spinal fusions.   Alm DELENA Jenny PT, DPT 06/27/2024 1:52 PM  "

## 2024-06-27 NOTE — ED Triage Notes (Signed)
 Pt here today after being sent from PT for incision check from knee surgery on 06/20/24. Still wearing original bandage from surgery.

## 2024-06-27 NOTE — Discharge Instructions (Addendum)
 Advised patient follow-up with surgeon on his regularly scheduled appointments.

## 2024-06-29 ENCOUNTER — Encounter: Payer: Self-pay | Admitting: Physical Therapy

## 2024-06-29 ENCOUNTER — Ambulatory Visit: Admitting: Physical Therapy

## 2024-06-29 DIAGNOSIS — R2689 Other abnormalities of gait and mobility: Secondary | ICD-10-CM

## 2024-06-29 DIAGNOSIS — M25561 Pain in right knee: Secondary | ICD-10-CM

## 2024-06-29 DIAGNOSIS — R600 Localized edema: Secondary | ICD-10-CM

## 2024-06-29 DIAGNOSIS — M6281 Muscle weakness (generalized): Secondary | ICD-10-CM

## 2024-06-29 NOTE — Therapy (Signed)
 " OUTPATIENT PHYSICAL THERAPY TREATMENT   Patient Name: Hunter Krause MRN: [id number] DOB:[birth year], 63 y.o., male Today's Date: 06/29/2024  END OF SESSION:  PT End of Session - 06/29/24 1316     Visit Number 2    Number of Visits 17    Date for Recertification  08/17/24    Authorization Type BC[employer]    Authorization Time Period 60VL per year    PT Start Time 1316    PT Stop Time 1358    PT Time Calculation (min) 42 min            Past Medical History:  Diagnosis Date   Arthritis    Asthma 1994   allergy induced asthma   Cancer (HCC)    kidney lesion   Coronary artery disease    Diabetes mellitus without complication (HCC) type 2    GERD (gastroesophageal reflux disease)    GI bleed 12/01/2021   admit to [facility]   GI bleed 12/01/2021   admit to [facility]   Gout    no recent flares   History of hiatal hernia    History of kidney stones    Hypertension    treated with hydrocholothiazide   Myocardial infarction (HCC) 2019   Pneumonia    Sleep apnea    treated with surgery. No problems now.   Past Surgical History:  Procedure Laterality Date   ARTHROSCOPY KNEE W/ DRILLING  2004   right knee   Blephroplasty  upper     cervical neck fusion     C 3 to C 5 has plates and screws   CHOLECYSTECTOMY     CORONARY ANGIOPLASTY  2019   stent x 1   CORONARY STENT INTERVENTION     CYSTOSCOPY/URETEROSCOPY/HOLMIUM LASER/STENT PLACEMENT Left 09/10/2020   Procedure: CYSTOSCOPY LEFT URETEROSCOPY/HOLMIUM LASER/STENT PLACEMENT;  Surgeon: Carolee Sherwood JONETTA DOUGLAS, MD;  Location: [facility];  Service: Urology;  Laterality: Left;   CYSTOSCOPY/URETEROSCOPY/HOLMIUM LASER/STENT PLACEMENT Bilateral 05/28/2022   Procedure: CYSTOSCOPY/ RETROGRADE/URETEROSCOPY/HOLMIUM LASER/STENT PLACEMENT;  Surgeon: [name], MD;  Location: [facility];  Service: Urology;  Laterality: Bilateral;   EXTRACORPOREAL SHOCK WAVE LITHOTRIPSY Left 09/03/2020   Procedure: LEFT  EXTRACORPOREAL SHOCK WAVE LITHOTRIPSY (ESWL);  Surgeon: Carolee Sherwood JONETTA DOUGLAS, MD;  Location: [facility];  Service: Urology;  Laterality: Left;   IR RADIOLOGIST EVAL & MGMT  09/23/2023   IR RADIOLOGIST EVAL & MGMT  12/22/2023   IR RADIOLOGIST EVAL & MGMT  03/17/2024   LITHOTRIPSY  2001   lower back  2020   fusion    X2    L 4 to L 6   L2 L3   NASAL SEPTUM SURGERY  2005   palate remove for osa   RADIOLOGY WITH ANESTHESIA Right 12/02/2023   Procedure: CT WITH ANESTHESIA;  Surgeon: Radiologist, Medication, MD;  Location: [facility];  Service: Radiology;  Laterality: Right;   TOTAL KNEE ARTHROPLASTY Right 06/20/2024   Procedure: ARTHROPLASTY, KNEE, TOTAL;  Surgeon: Yvone Rush, MD;  Location: [facility];  Service: Orthopedics;  Laterality: Right;   Patient Active Problem List   Diagnosis Date Noted   Osteoarthritis of right knee 06/19/2024    PCP: [name], MD   REFERRING PROVIDER: Yvone Rush, MD   REFERRING DIAG: [phone] (ICD-10-CM) - Presence of right artificial knee joint   THERAPY DIAG:  Right knee pain, unspecified chronicity  Localized edema  Muscle weakness (generalized)  Other abnormalities of gait and mobility  Rationale for Evaluation and  Treatment: Rehabilitation  ONSET DATE: R TKA 06/20/24  SUBJECTIVE:  Per eval: Pt endorses hx of chronic knee pain, 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 Since surgery has been receiving assistance from spouse and son for household tasks and lower body dressing. Utilizing RW. Has HEP from hospital. Able to manage steps out of home w/ inc time/difficulty.  Denies calf pain/swelling, no SOB/chest pain, no fevers/chills.   SUBJECTIVE STATEMENT: 06/29/2024: states urgent care visit went well, replaced bandage, no concerns with incision. Arrives w/ walking sticks, states they feel better than walker. No other new updates.   PERTINENT HISTORY: Asthma, DM2, GERD, GIB, gout, hx MI, HTN Hx lumbar/cervical  fusions  PAIN:  Are you having pain: 5/10 with medication  Per eval:  Location/description: R knee  Best-worst over past week: 4-9/10  - aggravating factors: moving knee, WB, lower body dressing - Easing factors: ice, medication    PRECAUTIONS: fall  RED FLAGS: None   WEIGHT BEARING RESTRICTIONS: No  FALLS:  Has patient fallen in last 6 months? 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  DIAGNOSTIC FINDINGS:  R TKA DOS 06/20/24   PATIENT SURVEYS:  LEFS: 11/80   COGNITION: Overall cognitive status: Within functional limits for tasks assessed     SENSATION: Does not endorse any sensory complaints  EDEMA/INSPECTION:  Incision obscured by bandage which is well-saturated in upper portion (approx 40-50%), no significant erythema, mild bruising about knee as expected ; edema WNL ; no calf pain or swelling, no TTP about calf or pain with DF   LOWER EXTREMITY ROM:      Right eval Left eval R 06/29/24  Hip flexion     Hip extension     Hip internal rotation     Hip external rotation     Knee extension   A: lacking 5-6 deg with heel prop  Knee flexion AA: 69 deg * P: 80 deg *  AA: 75 deg seated *  (Blank rows = not tested) (Key: WFL = within functional limits not formally assessed, * = concordant pain, s = stiffness/stretching sensation, NT = not tested)  Comments:    LOWER EXTREMITY MMT:    MMT Right eval Left eval  Hip flexion    Hip abduction (modified sitting)    Hip internal  rotation    Hip external rotation    Knee flexion    Knee extension    Ankle dorsiflexion     (Blank rows = not tested) (Key: WFL = within functional limits not formally assessed, * = concordant pain, s = stiffness/stretching sensation, NT = not tested)  Comments:  deferred on eval given acuity of surgery   FUNCTIONAL TESTS:  TUG: 24.98sec RW standard chair   06/29/24: - TUG: 12.84sec w/ walking sticks from lowest mat   GAIT: Distance walked: within clinic Assistive device utilized: Walker - 2 wheeled Level of assistance: Modified independence Comments: step to pattern leading with R  TREATMENT:   [facility] Adult PT Treatment:                                                DATE: 06/29/24 Therapeutic Exercise: Seated heel slides w/ strap and slider 2x12 RLE  QS + strap propped on stool x8  Knee flex/ext AAROM w/ foam roller, seated 2x15 HEP update + education/handout  Neuromuscular re-ed: Propped quad set w/ stool 3x8 cues for reduced HS compensations Bosu TKE push 2x10 RLE cues for comfortable force output and reduced truncal compensations    Therapeutic Activity: Mini squat at counter w/ UE support 4x5 cues for mechanics and comfortable ROM, symmetrical WB TUG + education     [facility] Adult PT Treatment:                                                DATE: 06/27/24 Arrive no charge visit   [facility] Adult PT Treatment:                                                DATE: 06/22/24 Therapeutic Exercise: Heel slides, quad set + strap, ankle pumps practice reps + HEP handout  Self Care: Education/discussion re: monitoring for post op complications and appropriate action should they occur, communicating w/ provider re: dressing, education re: frequent mobility within tolerance using RW, ankle pumps, appropriate icing/elevating regimen    PATIENT  EDUCATION:  Education details: rationale for interventions, HEP  Person educated: Patient Education method: Explanation, Demonstration, Tactile cues, Verbal cues Education comprehension: verbalized understanding, returned demonstration, verbal cues required, tactile cues required, and needs further education     HOME EXERCISE PROGRAM: Access Code: [email or web address] URL: [email or web address] Date: 06/29/2024 Prepared by: Alm Jenny  Exercises - Seated Quad Set  - 3-4 x daily - 1 sets - 8-10 reps - Seated Heel Slide  - 3-4 x daily - 1 sets - 8-10 reps - Seated Calf Stretch with Strap  - 3-4 x daily - 1 sets - 8 reps - Mini Squat with Counter Support  - 3-4 x daily - 1 sets - 5 reps  ASSESSMENT:  CLINICAL IMPRESSION: 06/29/2024: Pt arrives w/ walking sticks BIL, 5/10 pain with medication. States urgent care visit went well, replaced bandage and no concerns for surgical site. Today we expand volume for mobility work in open and closed chain, as well as quad activation activities. No adverse events, tolerates session well overall without increase in pain. ROM with modest improvement as noted above. Recommend continuing along current POC in order to address relevant deficits and improve functional tolerance. Pt departs today's session in no acute distress, all voiced questions/concerns addressed appropriately from PT perspective.     Per eval: Patient is a pleasant 63 y.o. gentleman who was seen today for physical therapy evaluation and treatment for R TKA DOS 06/20/24  He endorses expected post op limitations in ADLs and functional mobility, requiring RW for ambulation. No post op red flags, although upper portion of bandage is observed to be well-saturated (has been in ACE wrap since surgery) - advised pt to communicate w/ surgeon's  office re: wound care/dressing management, he states he will call after our session. He demonstrates anticipated deficits in knee ROM, quad activation, and  altered kinematics w/ gait/transfers. TUG is indicative of fall risk. No adverse events, tolerates exam/HEP well overall. Self care education as above. Recommend skilled PT to address aforementioned deficits with aim of improving functional tolerance and reducing pain with typical activities. Pt departs today's session in no acute distress, all voiced concerns/questions addressed appropriately from PT perspective.    OBJECTIVE IMPAIRMENTS: Abnormal gait, decreased activity tolerance, decreased balance, decreased endurance, decreased mobility, difficulty walking, decreased ROM, decreased strength, increased edema, impaired perceived functional ability, impaired flexibility, improper body mechanics, and pain.   ACTIVITY LIMITATIONS: carrying, lifting, bending, sitting, standing, squatting, sleeping, stairs, transfers, bed mobility, and locomotion level  PARTICIPATION LIMITATIONS: meal prep, cleaning, laundry, driving, shopping, community activity, and occupation  PERSONAL FACTORS: Time since onset of injury/illness/exacerbation and 3+ comorbidities: spinal fusions, asthma, DM2, gout, hx MI, HTN are also affecting patient's functional outcome.   REHAB POTENTIAL: Good  CLINICAL DECISION MAKING: Stable/uncomplicated  EVALUATION COMPLEXITY: Low   GOALS:  SHORT TERM GOALS: Target date: 07/20/2024  Pt will demonstrate appropriate understanding and performance of initially prescribed HEP in order to facilitate improved independence with management of symptoms.  Baseline: HEP established   Goal status: INITIAL   2. Pt will report at least 25% improvement in overall pain levels over past week in order to facilitate improved tolerance to typical daily activities.   Baseline: 4-9/10  Goal status: INITIAL    LONG TERM GOALS: Target date: 08/17/2024   Pt will score 50/80 or greater on LEFS in order to demonstrate improved perception of function due to symptoms (MCID 9 pts) Baseline: 11/80 Goal  status: INITIAL  2.  Pt will demonstrate at least 0-110 degrees of knee AROM on surgical limb in order to facilitate improved tolerance to functional movements such as squatting, walking, and stair navigation.  Baseline: see ROM chart above Goal status: INITIAL  3.  Pt will demonstrate appropriate performance of final prescribed HEP in order to facilitate improved self-management of symptoms post-discharge.   Baseline: initial HEP prescribed   Goal status: INITIAL    4.  Pt will be able to perform TUG in less than or equal to 13 sec with LRAD in order to indicate reduced risk of falling (cutoff score for fall risk 13.5 sec in community dwelling older adults per [facility] et al, 2000)  Baseline: 24.98sec RW  Goal status: INITIAL    5. Pt will report at least 50% decrease in overall pain levels in past week in order to facilitate improved tolerance to basic ADLs/mobility.   Baseline: 4-9/10  Goal status: INITIAL    6. Pt will demonstrate symmetrical knee MMT between surgical and non-surgical limb in order to facilitate improved functional strength and mechanics.  Baseline: deferred on eval given proximity to surgery  Goal status: INITIAL     PLAN:  PT FREQUENCY: 2x/week  PT DURATION: 8 weeks  PLANNED INTERVENTIONS: 97164- PT Re-evaluation, 97750- Physical Performance Testing, 97110-Therapeutic exercises, 97530- Therapeutic activity, [id number]- Neuromuscular re-education, 97535- Self Care, [id number]- Manual therapy, [id number]- Gait training, [phone]- Aquatic Therapy, [phone]- Vasopneumatic device, [phone] (1-2 muscles), 20561 (3+ muscles)- Dry Needling, Patient/Family education, Balance training, Stair training, Taping, Joint mobilization, Scar mobilization, Cryotherapy, and Moist heat  PLAN FOR NEXT SESSION: Review/update HEP PRN. Work on [employer] exercises as appropriate with emphasis on quad activation, knee ext/flex ROM, functional mechanics. Symptom modification strategies as  indicated/appropriate. Mindful of MI hx and spinal fusions.   Alm DELENA Jenny PT, DPT 06/29/2024 2:05 PM  "

## 2024-07-04 ENCOUNTER — Ambulatory Visit: Attending: Orthopedic Surgery | Admitting: Rehabilitative and Restorative Service Providers"

## 2024-07-04 ENCOUNTER — Encounter: Payer: Self-pay | Admitting: Rehabilitative and Restorative Service Providers"

## 2024-07-04 DIAGNOSIS — R2689 Other abnormalities of gait and mobility: Secondary | ICD-10-CM | POA: Diagnosis present

## 2024-07-04 DIAGNOSIS — R600 Localized edema: Secondary | ICD-10-CM | POA: Insufficient documentation

## 2024-07-04 DIAGNOSIS — M6281 Muscle weakness (generalized): Secondary | ICD-10-CM | POA: Insufficient documentation

## 2024-07-04 DIAGNOSIS — M25561 Pain in right knee: Secondary | ICD-10-CM | POA: Insufficient documentation

## 2024-07-04 NOTE — Therapy (Signed)
 " OUTPATIENT PHYSICAL THERAPY TREATMENT   Patient Name: Hunter SUSTAITA MRN: [id number] DOB:Jun 04, 1961, 64 y.o., male Today's Date: 07/04/2024  END OF SESSION:  PT End of Session - 07/04/24 1517     Visit Number 3    Number of Visits 17    Date for Recertification  08/17/24    Authorization Type BC[employer]    Authorization Time Period 60VL per year    PT Start Time 1517    PT Stop Time 1603    PT Time Calculation (min) 46 min    Activity Tolerance No increased pain;Patient tolerated treatment well            Past Medical History:  Diagnosis Date   Arthritis    Asthma 1994   allergy induced asthma   Cancer (HCC)    kidney lesion   Coronary artery disease    Diabetes mellitus without complication (HCC) type 2    GERD (gastroesophageal reflux disease)    GI bleed 12/01/2021   admit to [facility]   GI bleed 12/01/2021   admit to [facility]   Gout    no recent flares   History of hiatal hernia    History of kidney stones    Hypertension    treated with hydrocholothiazide   Myocardial infarction (HCC) 2019   Pneumonia    Sleep apnea    treated with surgery. No problems now.   Past Surgical History:  Procedure Laterality Date   ARTHROSCOPY KNEE W/ DRILLING  2004   right knee   Blephroplasty  upper     cervical neck fusion     C 3 to C 5 has plates and screws   CHOLECYSTECTOMY     CORONARY ANGIOPLASTY  2019   stent x 1   CORONARY STENT INTERVENTION     CYSTOSCOPY/URETEROSCOPY/HOLMIUM LASER/STENT PLACEMENT Left 09/10/2020   Procedure: CYSTOSCOPY LEFT URETEROSCOPY/HOLMIUM LASER/STENT PLACEMENT;  Surgeon: Carolee Sherwood JONETTA DOUGLAS, MD;  Location: [facility];  Service: Urology;  Laterality: Left;   CYSTOSCOPY/URETEROSCOPY/HOLMIUM LASER/STENT PLACEMENT Bilateral 05/28/2022   Procedure: CYSTOSCOPY/ RETROGRADE/URETEROSCOPY/HOLMIUM LASER/STENT PLACEMENT;  Surgeon: [name], MD;  Location: [facility];  Service: Urology;  Laterality: Bilateral;    EXTRACORPOREAL SHOCK WAVE LITHOTRIPSY Left 09/03/2020   Procedure: LEFT EXTRACORPOREAL SHOCK WAVE LITHOTRIPSY (ESWL);  Surgeon: Carolee Sherwood JONETTA DOUGLAS, MD;  Location: [facility];  Service: Urology;  Laterality: Left;   IR RADIOLOGIST EVAL & MGMT  09/23/2023   IR RADIOLOGIST EVAL & MGMT  12/22/2023   IR RADIOLOGIST EVAL & MGMT  03/17/2024   LITHOTRIPSY  2001   lower back  2020   fusion    X2    L 4 to L 6   L2 L3   NASAL SEPTUM SURGERY  2005   palate remove for osa   RADIOLOGY WITH ANESTHESIA Right 12/02/2023   Procedure: CT WITH ANESTHESIA;  Surgeon: Radiologist, Medication, MD;  Location: [facility];  Service: Radiology;  Laterality: Right;   TOTAL KNEE ARTHROPLASTY Right 06/20/2024   Procedure: ARTHROPLASTY, KNEE, TOTAL;  Surgeon: Yvone Rush, MD;  Location: [facility];  Service: Orthopedics;  Laterality: Right;   Patient Active Problem List   Diagnosis Date Noted   Osteoarthritis of right knee 06/19/2024    PCP: [name], MD   REFERRING PROVIDER: Yvone Rush, MD   REFERRING DIAG: [phone] (ICD-10-CM) - Presence of right artificial knee joint   THERAPY DIAG:  Right knee pain, unspecified chronicity  Localized edema  Muscle weakness (generalized)  Other abnormalities of gait and mobility  Rationale for Evaluation and Treatment: Rehabilitation  ONSET DATE: R TKA 06/20/24  SUBJECTIVE:  Per eval: Pt endorses hx of chronic knee pain, 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 Since surgery has been receiving assistance from spouse and son for household tasks and lower body dressing. Utilizing RW. Has HEP from hospital. Able to manage steps out of home w/ inc time/difficulty.  Denies calf pain/swelling, no SOB/chest pain, no fevers/chills.   SUBJECTIVE STATEMENT: 07/04/2024: patient states he has been doing his exercises at home. Continues to use walking sticks for ambulation. Icing at home 3 maybe 4 times a day.  PERTINENT HISTORY: Asthma, DM2,  GERD, GIB, gout, hx MI, HTN Hx lumbar/cervical fusions  PAIN:  Are you having pain: 4/10 with medication; at rest 2/10   Per eval:  Location/description: R knee  Best-worst over past week: 4-9/10  - aggravating factors: moving knee, WB, lower body dressing - Easing factors: ice, medication    PRECAUTIONS: fall  RED FLAGS: None   WEIGHT BEARING RESTRICTIONS: No  FALLS:  Has patient fallen in last 6 months? 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  DIAGNOSTIC FINDINGS:  R TKA DOS 06/20/24   PATIENT SURVEYS:  LEFS: 11/80   COGNITION: Overall cognitive status: Within functional limits for tasks assessed     SENSATION: Does not endorse any sensory complaints  EDEMA/INSPECTION:  Incision obscured by bandage which is well-saturated in upper portion (approx 40-50%), no significant erythema, mild bruising about knee as expected ; edema WNL ; no calf pain or swelling, no TTP about calf or pain with DF   LOWER EXTREMITY ROM:      Right eval Left eval R 06/29/24  Hip flexion     Hip extension     Hip internal rotation     Hip external rotation     Knee extension   A: lacking 5-6 deg with heel prop  Knee flexion AA: 69 deg * P: 80 deg *  AA: 75 deg seated *  (Blank rows = not tested) (Key: WFL = within functional limits not formally assessed, * = concordant pain, s = stiffness/stretching sensation, NT = not tested)  Comments:    LOWER EXTREMITY MMT:    MMT Right eval Left eval  Hip  flexion    Hip abduction (modified sitting)    Hip internal rotation    Hip external rotation    Knee flexion    Knee extension    Ankle dorsiflexion     (Blank rows = not tested) (Key: WFL = within functional limits not formally assessed, * = concordant pain, s = stiffness/stretching sensation, NT = not tested)  Comments:  deferred on eval given acuity of surgery   FUNCTIONAL TESTS:  TUG: 24.98sec RW standard chair   06/29/24: - TUG: 12.84sec w/ walking sticks from lowest mat   GAIT: Distance walked: within clinic Assistive device utilized: Walker - 2 wheeled Level of assistance: Modified  independence Comments: step to pattern leading with R                                                                                                                                TREATMENT:   [facility] Adult PT Treatment:                                                DATE: 07/04/24 Therapeutic Exercise: Nustep L 5 x 5 min for warm up and ROM using UEs  Hamstring stretch with strap 30 sec x 3  Knee flexion heels on green therapeutic ball patient assisting with knee flexion with strap 10 sec x 10 R LE  Supine heel slides w/ strap and slider 2x12 RLE  Knee flex/ext AAROM w/ blue foam roller, seated 2x15 Passive knee extension sitting heel propped on floor pt pushing knee into extension x 5   Neuromuscular re-ed: Quad set 5 sec x 10 towel behind knee SLR small range 3 sec x 10  SAQ 5 sec x 10  Sidelyihg Hip abduction leading with heel 3 sec x 10 x 2  Bosu TKE push 2x10 RLE     Therapeutic Activity: Mini squat at counter w/ UE support  2x10 cues symmetrical WB Standing back to counter straightening hips and knees 5 reps  Side steps at counter ~ 10 feet x 4 R/L    [facility] Adult PT Treatment:                                                DATE: 06/29/24 Therapeutic Exercise: Seated heel slides w/ strap and slider 2x12 RLE  QS + strap propped on stool x8  Knee flex/ext AAROM w/ foam roller,  seated 2x15 HEP update + education/handout  Neuromuscular re-ed: Propped quad set w/ stool 3x8 cues for reduced HS compensations Bosu TKE push 2x10 RLE cues for comfortable force output and reduced truncal compensations    Therapeutic Activity: Mini squat at counter w/ UE support 4x5 cues for mechanics and comfortable ROM, symmetrical WB TUG + education     [facility] Adult PT Treatment:                                                DATE: 06/27/24 Arrive no charge visit   [facility] Adult PT Treatment:  DATE: 06/22/24 Therapeutic Exercise: Heel slides, quad set + strap, ankle pumps practice reps + HEP handout  Self Care: Education/discussion re: monitoring for post op complications and appropriate action should they occur, communicating w/ provider re: dressing, education re: frequent mobility within tolerance using RW, ankle pumps, appropriate icing/elevating regimen    PATIENT EDUCATION:  Education details: rationale for interventions, HEP  Person educated: Patient Education method: Explanation, Demonstration, Tactile cues, Verbal cues Education comprehension: verbalized understanding, returned demonstration, verbal cues required, tactile cues required, and needs further education     HOME EXERCISE PROGRAM: Access Code: [email or web address] URL: [email or web address] Date: 06/29/2024 Prepared by: Alm Jenny  Exercises - Seated Quad Set  - 3-4 x daily - 1 sets - 8-10 reps - Seated Heel Slide  - 3-4 x daily - 1 sets - 8-10 reps - Seated Calf Stretch with Strap  - 3-4 x daily - 1 sets - 8 reps - Mini Squat with Counter Support  - 3-4 x daily - 1 sets - 5 reps  ASSESSMENT:  CLINICAL IMPRESSION: 07/04/2024: continued work on ROM and strength R LE patient supine, sitting and standing. Reviewed and progressed exercises as noted above. Patient tolerated treatment without difficulty. He continues to use walking sticks BIL for gait and at  times not using sticks for a few steps. He states that he will ice at home.   Per eval: Patient is a pleasant 64 y.o. gentleman who was seen today for physical therapy evaluation and treatment for R TKA DOS 06/20/24  He endorses expected post op limitations in ADLs and functional mobility, requiring RW for ambulation. No post op red flags, although upper portion of bandage is observed to be well-saturated (has been in ACE wrap since surgery) - advised pt to communicate w/ surgeon's office re: wound care/dressing management, he states he will call after our session. He demonstrates anticipated deficits in knee ROM, quad activation, and altered kinematics w/ gait/transfers. TUG is indicative of fall risk. No adverse events, tolerates exam/HEP well overall. Self care education as above. Recommend skilled PT to address aforementioned deficits with aim of improving functional tolerance and reducing pain with typical activities. Pt departs today's session in no acute distress, all voiced concerns/questions addressed appropriately from PT perspective.    OBJECTIVE IMPAIRMENTS: Abnormal gait, decreased activity tolerance, decreased balance, decreased endurance, decreased mobility, difficulty walking, decreased ROM, decreased strength, increased edema, impaired perceived functional ability, impaired flexibility, improper body mechanics, and pain.   ACTIVITY LIMITATIONS: carrying, lifting, bending, sitting, standing, squatting, sleeping, stairs, transfers, bed mobility, and locomotion level  PARTICIPATION LIMITATIONS: meal prep, cleaning, laundry, driving, shopping, community activity, and occupation  PERSONAL FACTORS: Time since onset of injury/illness/exacerbation and 3+ comorbidities: spinal fusions, asthma, DM2, gout, hx MI, HTN are also affecting patient's functional outcome.   REHAB POTENTIAL: Good  CLINICAL DECISION MAKING: Stable/uncomplicated  EVALUATION COMPLEXITY: Low   GOALS:  SHORT TERM  GOALS: Target date: 07/20/2024  Pt will demonstrate appropriate understanding and performance of initially prescribed HEP in order to facilitate improved independence with management of symptoms.  Baseline: HEP established   Goal status: INITIAL   2. Pt will report at least 25% improvement in overall pain levels over past week in order to facilitate improved tolerance to typical daily activities.   Baseline: 4-9/10  Goal status: INITIAL    LONG TERM GOALS: Target date: 08/17/2024   Pt will score 50/80 or greater on LEFS in order to demonstrate improved perception of function due to symptoms (MCID 9 pts)  Baseline: 11/80 Goal status: INITIAL  2.  Pt will demonstrate at least 0-110 degrees of knee AROM on surgical limb in order to facilitate improved tolerance to functional movements such as squatting, walking, and stair navigation.  Baseline: see ROM chart above Goal status: INITIAL  3.  Pt will demonstrate appropriate performance of final prescribed HEP in order to facilitate improved self-management of symptoms post-discharge.   Baseline: initial HEP prescribed   Goal status: INITIAL    4.  Pt will be able to perform TUG in less than or equal to 13 sec with LRAD in order to indicate reduced risk of falling (cutoff score for fall risk 13.5 sec in community dwelling older adults per [facility] et al, 2000)  Baseline: 24.98sec RW  Goal status: INITIAL    5. Pt will report at least 50% decrease in overall pain levels in past week in order to facilitate improved tolerance to basic ADLs/mobility.   Baseline: 4-9/10  Goal status: INITIAL    6. Pt will demonstrate symmetrical knee MMT between surgical and non-surgical limb in order to facilitate improved functional strength and mechanics.  Baseline: deferred on eval given proximity to surgery  Goal status: INITIAL     PLAN:  PT FREQUENCY: 2x/week  PT DURATION: 8 weeks  PLANNED INTERVENTIONS: 97164- PT Re-evaluation, 97750- Physical  Performance Testing, 97110-Therapeutic exercises, 97530- Therapeutic activity, [id number]- Neuromuscular re-education, 97535- Self Care, [id number]- Manual therapy, [id number]- Gait training, [phone]- Aquatic Therapy, [phone]- Vasopneumatic device, [phone] (1-2 muscles), [id number] (3+ muscles)- Dry Needling, Patient/Family education, Balance training, Stair training, Taping, Joint mobilization, Scar mobilization, Cryotherapy, and Moist heat  PLAN FOR NEXT SESSION: Review/update HEP PRN. Work on [employer] exercises as appropriate with emphasis on quad activation, knee ext/flex ROM, functional mechanics. Symptom modification strategies as indicated/appropriate. Mindful of MI hx and spinal fusions.   Perl Kerney P. Ina PT, MPH 07/04/2024 3:18 PM  "

## 2024-07-05 ENCOUNTER — Ambulatory Visit: Admitting: Physical Therapy

## 2024-07-06 ENCOUNTER — Encounter: Payer: Self-pay | Admitting: Rehabilitative and Restorative Service Providers"

## 2024-07-06 ENCOUNTER — Ambulatory Visit: Admitting: Rehabilitative and Restorative Service Providers"

## 2024-07-06 DIAGNOSIS — R2689 Other abnormalities of gait and mobility: Secondary | ICD-10-CM

## 2024-07-06 DIAGNOSIS — M6281 Muscle weakness (generalized): Secondary | ICD-10-CM

## 2024-07-06 DIAGNOSIS — M25561 Pain in right knee: Secondary | ICD-10-CM

## 2024-07-06 DIAGNOSIS — R600 Localized edema: Secondary | ICD-10-CM

## 2024-07-06 NOTE — Therapy (Signed)
 " OUTPATIENT PHYSICAL THERAPY TREATMENT   Patient Name: Hunter Krause MRN: [id number] DOB:04/10/1961, 64 y.o., male Today's Date: 07/06/2024  END OF SESSION:  PT End of Session - 07/06/24 1538     Visit Number 4    Number of Visits 17    Date for Recertification  08/17/24    Authorization Type BC[employer]    Authorization Time Period 60VL per year    PT Start Time 1530    PT Stop Time 1615    PT Time Calculation (min) 45 min    Activity Tolerance No increased pain;Patient tolerated treatment well            Past Medical History:  Diagnosis Date   Arthritis    Asthma 1994   allergy induced asthma   Cancer (HCC)    kidney lesion   Coronary artery disease    Diabetes mellitus without complication (HCC) type 2    GERD (gastroesophageal reflux disease)    GI bleed 12/01/2021   admit to [facility]   GI bleed 12/01/2021   admit to [facility]   Gout    no recent flares   History of hiatal hernia    History of kidney stones    Hypertension    treated with hydrocholothiazide   Myocardial infarction (HCC) 2019   Pneumonia    Sleep apnea    treated with surgery. No problems now.   Past Surgical History:  Procedure Laterality Date   ARTHROSCOPY KNEE W/ DRILLING  2004   right knee   Blephroplasty  upper     cervical neck fusion     C 3 to C 5 has plates and screws   CHOLECYSTECTOMY     CORONARY ANGIOPLASTY  2019   stent x 1   CORONARY STENT INTERVENTION     CYSTOSCOPY/URETEROSCOPY/HOLMIUM LASER/STENT PLACEMENT Left 09/10/2020   Procedure: CYSTOSCOPY LEFT URETEROSCOPY/HOLMIUM LASER/STENT PLACEMENT;  Surgeon: Carolee Sherwood JONETTA DOUGLAS, MD;  Location: [facility];  Service: Urology;  Laterality: Left;   CYSTOSCOPY/URETEROSCOPY/HOLMIUM LASER/STENT PLACEMENT Bilateral 05/28/2022   Procedure: CYSTOSCOPY/ RETROGRADE/URETEROSCOPY/HOLMIUM LASER/STENT PLACEMENT;  Surgeon: [name], MD;  Location: [facility];  Service: Urology;  Laterality: Bilateral;    EXTRACORPOREAL SHOCK WAVE LITHOTRIPSY Left 09/03/2020   Procedure: LEFT EXTRACORPOREAL SHOCK WAVE LITHOTRIPSY (ESWL);  Surgeon: Carolee Sherwood JONETTA DOUGLAS, MD;  Location: [facility];  Service: Urology;  Laterality: Left;   IR RADIOLOGIST EVAL & MGMT  09/23/2023   IR RADIOLOGIST EVAL & MGMT  12/22/2023   IR RADIOLOGIST EVAL & MGMT  03/17/2024   LITHOTRIPSY  2001   lower back  2020   fusion    X2    L 4 to L 6   L2 L3   NASAL SEPTUM SURGERY  2005   palate remove for osa   RADIOLOGY WITH ANESTHESIA Right 12/02/2023   Procedure: CT WITH ANESTHESIA;  Surgeon: Radiologist, Medication, MD;  Location: [facility];  Service: Radiology;  Laterality: Right;   TOTAL KNEE ARTHROPLASTY Right 06/20/2024   Procedure: ARTHROPLASTY, KNEE, TOTAL;  Surgeon: Yvone Rush, MD;  Location: [facility];  Service: Orthopedics;  Laterality: Right;   Patient Active Problem List   Diagnosis Date Noted   Osteoarthritis of right knee 06/19/2024    PCP: [name], MD   REFERRING PROVIDER: Yvone Rush, MD   REFERRING DIAG: [phone] (ICD-10-CM) - Presence of right artificial knee joint   THERAPY DIAG:  Right knee pain, unspecified chronicity  Localized edema  Muscle weakness (generalized)  Other abnormalities of gait and mobility  Rationale for Evaluation and Treatment: Rehabilitation  ONSET DATE: R TKA 06/20/24  SUBJECTIVE:  Per eval: Pt endorses hx of chronic knee pain, 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 Since surgery has been receiving assistance from spouse and son for household tasks and lower body dressing. Utilizing RW. Has HEP from hospital. Able to manage steps out of home w/ inc time/difficulty.  Denies calf pain/swelling, no SOB/chest pain, no fevers/chills.   SUBJECTIVE STATEMENT: 07/06/2024: patient states that he saw the PA yesterday and had some fluid drawn from his knee. He has had increased soreness in the knee and some pain in the spot where the needle went in.  Has been icing the knee to help with the pain. He is using one walking stick for ambulation and sometimes no assistive device. Icing at home 3 maybe 4 times a day.  PERTINENT HISTORY: Asthma, DM2, GERD, GIB, gout, hx MI, HTN Hx lumbar/cervical fusions  PAIN:  Are you having pain: 4/10 with medication; at rest 2/10   Per eval:  Location/description: R knee  Best-worst over past week: 4-9/10  - aggravating factors: moving knee, WB, lower body dressing - Easing factors: ice, medication    PRECAUTIONS: fall  WEIGHT BEARING RESTRICTIONS: No  FALLS:  Has patient fallen in last 6 months? 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  DIAGNOSTIC FINDINGS:  R TKA DOS 06/20/24   PATIENT SURVEYS:  LEFS: 11/80    SENSATION: Does not endorse any sensory complaints  EDEMA/INSPECTION:  Incision obscured by bandage which is well-saturated in upper portion (approx 40-50%), no significant erythema, mild bruising about knee as expected ; edema WNL ; no calf pain or swelling, no TTP about calf or pain with DF   LOWER EXTREMITY ROM:      Right eval Left eval R 06/29/24  Hip flexion     Hip extension     Hip internal rotation     Hip external rotation     Knee extension   A: lacking 5-6 deg with heel prop  Knee flexion AA: 69 deg * P: 80 deg *  AA: 75 deg seated *  (Blank rows = not tested) (Key: WFL = within functional limits not formally assessed, * = concordant pain, s = stiffness/stretching sensation, NT  = not tested)  Comments:    LOWER EXTREMITY MMT:    MMT Right eval Left eval  Hip flexion    Hip abduction (modified sitting)    Hip internal rotation    Hip external rotation    Knee flexion    Knee extension    Ankle dorsiflexion     (Blank rows = not tested) (Key: WFL = within functional limits not formally assessed, * = concordant pain, s = stiffness/stretching sensation, NT = not tested)  Comments:  deferred on eval given acuity of surgery   FUNCTIONAL TESTS:  TUG: 24.98sec RW standard chair   06/29/24: - TUG: 12.84sec w/  walking sticks from lowest mat   GAIT: Distance walked: within clinic Assistive device utilized: Environmental Consultant - 2 wheeled Level of assistance: Modified independence Comments: step to pattern leading with R                                                                                                                                TREATMENT:   [facility] Adult PT Treatment:                                                DATE: 07/06/24 Therapeutic Exercise: Nustep L 5 x 9 min for warm up and ROM using UEs  LE supported on bolster and pillow for comfort - ankle pumps, circles CW/CCW LE supported on bolster and pillow for comfort quad set partial range 3 sec x 10  Supine heel slides w/ strap and slider 2x12 RLE  Neuromuscular re-ed: Quad set 5 sec x 10 pillow behind knee SLR small range 3 sec x 10  Therapeutic Activity:   [facility] Adult PT Treatment:                                                DATE: 07/04/24 Therapeutic Exercise: Nustep L 5 x 5 min for warm up and ROM using UEs  Hamstring stretch with strap 30 sec x 3  Knee flexion heels on green therapeutic ball patient assisting with knee flexion with strap 10 sec x 10 R LE  Supine heel slides w/ strap and slider 2x12 RLE  Knee flex/ext AAROM w/ blue foam roller, seated 2x15 Passive knee extension sitting heel propped on floor pt pushing knee into extension x 5   Neuromuscular re-ed: Quad set 5 sec x 10  towel behind knee SLR small range 3 sec x 10  SAQ 5 sec x 10  Sidelyihg Hip abduction leading with heel 3 sec x 10 x 2  Bosu TKE push 2x10 RLE     Therapeutic Activity: Mini squat at counter w/ UE support  2x10 cues symmetrical WB Standing back to counter straightening hips and knees 5 reps  Side steps at counter ~ 10 feet x 4 R/L    [facility] Adult PT Treatment:                                                DATE: 06/29/24 Therapeutic Exercise: Seated heel slides w/ strap and slider 2x12 RLE  QS + strap propped on stool x8  Knee flex/ext AAROM w/ foam roller, seated 2x15 HEP update + education/handout  Neuromuscular re-ed: Propped quad set w/ stool 3x8 cues for reduced HS compensations Bosu TKE push 2x10 RLE cues for comfortable force output and reduced truncal compensations    Therapeutic Activity: Mini squat at counter w/ UE support 4x5 cues for mechanics and comfortable ROM, symmetrical WB TUG + education     [facility] Adult PT Treatment:                                                DATE: 06/27/24 Arrive no charge visit   [facility] Adult PT Treatment:                                                DATE: 06/22/24 Therapeutic Exercise: Heel slides, quad set + strap, ankle pumps practice reps + HEP handout  Self Care: Education/discussion re: monitoring for post op complications and appropriate action should they occur, communicating w/ provider re: dressing, education re: frequent mobility within tolerance using RW, ankle pumps, appropriate icing/elevating regimen    PATIENT EDUCATION:  Education details: rationale for interventions, HEP  Person educated: Patient Education method: Explanation, Demonstration, Tactile cues, Verbal cues Education comprehension: verbalized understanding, returned demonstration, verbal cues required, tactile cues required, and needs further education     HOME EXERCISE PROGRAM: Access Code: [email or web address] URL: [email or web address] Date:  06/29/2024 Prepared by: Alm Jenny  Exercises - Seated Quad Set  - 3-4 x daily - 1 sets - 8-10 reps - Seated Heel Slide  - 3-4 x daily - 1 sets - 8-10 reps - Seated Calf Stretch with Strap  - 3-4 x daily - 1 sets - 8 reps - Mini Squat with Counter Support  - 3-4 x daily - 1 sets - 5 reps  ASSESSMENT:  CLINICAL IMPRESSION: 07/06/2024: patient returns with increased pain and discomfort in R knee following aspiration of fluid from R knee at MD visit yesterday. He has done some squats and exercises earlier today. He has also moved from using 2 walking sticks to 1 for walking and is walking some at home without assistive device. Discussed the importance of resting the tissue and decreasing pain following aspiration of knee. Advised patient to use walker or cane for gait to avoid increased irritation with standing and walking. Decreased exercise intensity of exercises today. Patient will continue with modified HEP until pain subsides.  Added vaso post treatment to address edema and pain.   Per eval: Patient is a pleasant 64 y.o. gentleman who was seen today for physical therapy evaluation and treatment for R TKA DOS 06/20/24  He endorses expected post op limitations in ADLs and functional mobility, requiring RW for ambulation. No post op red flags, although upper portion of bandage is observed to be well-saturated (has been in ACE wrap since surgery) - advised pt to communicate w/ surgeon's office re: wound care/dressing management, he states he will call after our session. He demonstrates anticipated deficits in knee ROM, quad activation, and altered kinematics w/ gait/transfers. TUG is indicative of fall risk. No adverse events, tolerates exam/HEP well overall. Self care education as above. Recommend skilled PT to address aforementioned deficits with aim of improving functional tolerance and reducing pain with typical activities. Pt departs today's session in no acute distress, all voiced  concerns/questions addressed appropriately from PT perspective.    OBJECTIVE IMPAIRMENTS: Abnormal gait, decreased activity tolerance, decreased balance, decreased endurance, decreased mobility, difficulty walking, decreased ROM, decreased strength, increased edema, impaired perceived functional ability, impaired flexibility, improper body mechanics, and pain.    GOALS:  SHORT TERM GOALS: Target date: 07/20/2024  Pt will demonstrate appropriate understanding and performance of initially prescribed HEP in order to facilitate improved independence with management of symptoms.  Baseline: HEP established   Goal status: INITIAL   2. Pt will report at least 25% improvement in overall pain levels over past week in order to facilitate improved tolerance to typical daily activities.   Baseline: 4-9/10  Goal status: INITIAL    LONG TERM GOALS: Target date: 08/17/2024   Pt will score 50/80 or greater on LEFS in order to demonstrate improved perception of function due to symptoms (MCID 9 pts) Baseline: 11/80 Goal status: INITIAL  2.  Pt will demonstrate at least 0-110 degrees of knee AROM on surgical limb in order to facilitate improved tolerance to functional movements such as squatting, walking, and stair navigation.  Baseline: see ROM chart above Goal status: INITIAL  3.  Pt will demonstrate appropriate performance of final prescribed HEP in order to facilitate improved self-management of symptoms post-discharge.   Baseline: initial HEP prescribed   Goal status: INITIAL    4.  Pt will be able to perform TUG in less than or equal to 13 sec with LRAD in order to indicate reduced risk of falling (cutoff score for fall risk 13.5 sec in community dwelling older adults per [facility] et al, 2000)  Baseline: 24.98sec RW  Goal status: INITIAL    5. Pt will report at least 50% decrease in overall pain levels in past week in order to facilitate improved tolerance to basic ADLs/mobility.   Baseline:  4-9/10  Goal status: INITIAL    6. Pt will demonstrate symmetrical knee MMT between surgical and non-surgical limb in order to facilitate improved functional strength and mechanics.  Baseline: deferred on eval given proximity to surgery  Goal status: INITIAL     PLAN:  PT FREQUENCY: 2x/week  PT DURATION: 8 weeks  PLANNED INTERVENTIONS: 97164- PT Re-evaluation, 97750- Physical Performance Testing, 97110-Therapeutic exercises, 97530- Therapeutic activity, [id number]- Neuromuscular re-education, 97535- Self Care, [id number]- Manual therapy, [id number]- Gait training, [phone]- Aquatic Therapy, [phone]- Vasopneumatic device, [phone] (1-2 muscles), 20561 (3+ muscles)- Dry Needling, Patient/Family education, Balance training, Stair training, Taping, Joint mobilization, Scar mobilization, Cryotherapy, and Moist heat  PLAN FOR NEXT SESSION: Review/update HEP PRN. Work on [employer] exercises as appropriate with emphasis on quad activation, knee ext/flex ROM, functional mechanics. Symptom modification strategies as indicated/appropriate. Mindful of MI hx and spinal fusions.   Corion Sherrod P. Ina PT, MPH 07/06/2024 3:39 PM  "

## 2024-07-11 ENCOUNTER — Encounter: Payer: Self-pay | Admitting: Rehabilitative and Restorative Service Providers"

## 2024-07-11 ENCOUNTER — Ambulatory Visit: Admitting: Rehabilitative and Restorative Service Providers"

## 2024-07-11 DIAGNOSIS — R600 Localized edema: Secondary | ICD-10-CM

## 2024-07-11 DIAGNOSIS — R2689 Other abnormalities of gait and mobility: Secondary | ICD-10-CM

## 2024-07-11 DIAGNOSIS — M6281 Muscle weakness (generalized): Secondary | ICD-10-CM

## 2024-07-11 DIAGNOSIS — M25561 Pain in right knee: Secondary | ICD-10-CM | POA: Diagnosis not present

## 2024-07-11 NOTE — Therapy (Signed)
 " OUTPATIENT PHYSICAL THERAPY TREATMENT   Patient Name: Hunter Krause MRN: [id number] DOB:Aug 04, 1960, 64 y.o., male Today's Date: 07/11/2024  END OF SESSION:  PT End of Session - 07/11/24 1530     Visit Number 5    Number of Visits 17    Date for Recertification  08/17/24    Authorization Type [employer]    Authorization Time Period 60VL per year    PT Start Time 1530    PT Stop Time 1615    PT Time Calculation (min) 45 min    Activity Tolerance No increased pain;Patient tolerated treatment well            Past Medical History:  Diagnosis Date   Arthritis    Asthma 1994   allergy induced asthma   Cancer (HCC)    kidney lesion   Coronary artery disease    Diabetes mellitus without complication (HCC) type 2    GERD (gastroesophageal reflux disease)    GI bleed 12/01/2021   admit to [facility]   GI bleed 12/01/2021   admit to [facility]   Gout    no recent flares   History of hiatal hernia    History of kidney stones    Hypertension    treated with hydrocholothiazide   Myocardial infarction (HCC) 2019   Pneumonia    Sleep apnea    treated with surgery. No problems now.   Past Surgical History:  Procedure Laterality Date   ARTHROSCOPY KNEE W/ DRILLING  2004   right knee   Blephroplasty  upper     cervical neck fusion     C 3 to C 5 has plates and screws   CHOLECYSTECTOMY     CORONARY ANGIOPLASTY  2019   stent x 1   CORONARY STENT INTERVENTION     CYSTOSCOPY/URETEROSCOPY/HOLMIUM LASER/STENT PLACEMENT Left 09/10/2020   Procedure: CYSTOSCOPY LEFT URETEROSCOPY/HOLMIUM LASER/STENT PLACEMENT;  Surgeon: Carolee Sherwood JONETTA DOUGLAS, MD;  Location: [facility];  Service: Urology;  Laterality: Left;   CYSTOSCOPY/URETEROSCOPY/HOLMIUM LASER/STENT PLACEMENT Bilateral 05/28/2022   Procedure: CYSTOSCOPY/ RETROGRADE/URETEROSCOPY/HOLMIUM LASER/STENT PLACEMENT;  Surgeon: [name], MD;  Location: [facility];  Service: Urology;  Laterality: Bilateral;    EXTRACORPOREAL SHOCK WAVE LITHOTRIPSY Left 09/03/2020   Procedure: LEFT EXTRACORPOREAL SHOCK WAVE LITHOTRIPSY (ESWL);  Surgeon: Carolee Sherwood JONETTA DOUGLAS, MD;  Location[facility];  Service: Urology;  Laterality: Left;   IR RADIOLOGIST EVAL & MGMT  09/23/2023   IR RADIOLOGIST EVAL & MGMT  12/22/2023   IR RADIOLOGIST EVAL & MGMT  03/17/2024   LITHOTRIPSY  2001   lower back  2020   fusion    X2    L 4 to L 6   L2 L3   NASAL SEPTUM SURGERY  2005   palate remove for osa   RADIOLOGY WITH ANESTHESIA Right 12/02/2023   Procedure: CT WITH ANESTHESIA;  Surgeon: Radiologist, Medication, MD;  Location: [facility];  Service: Radiology;  Laterality: Right;   TOTAL KNEE ARTHROPLASTY Right 06/20/2024   Procedure: ARTHROPLASTY, KNEE, TOTAL;  Surgeon: Yvone Rush, MD;  Location: [facility];  Service: Orthopedics;  Laterality: Right;   Patient Active Problem List   Diagnosis Date Noted   Osteoarthritis of right knee 06/19/2024    PCP: [name], MD   REFERRING PROVIDER: Yvone Rush, MD   REFERRING DIAG: [phone] (ICD-10-CM) - Presence of right artificial knee joint   THERAPY DIAG:  Right knee pain, unspecified chronicity  Localized edema  Muscle weakness (generalized)  Other abnormalities of gait and mobility  Rationale for Evaluation and Treatment: Rehabilitation  ONSET DATE: R TKA 06/20/24  SUBJECTIVE:  Per eval: Pt endorses hx of chronic knee pain, 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 Since surgery has been receiving assistance from spouse and son for household tasks and lower body dressing. Utilizing RW. Has HEP from hospital. Able to manage steps out of home w/ inc time/difficulty.  Denies calf pain/swelling, no SOB/chest pain, no fevers/chills.   SUBJECTIVE STATEMENT: 07/11/2024: patient states that his knee has calmed down from last week. He has rested and iced the knee and he is having less swelling. He does have some back pain which has been there in  the past and is just flared up probably due to limping and more sitting and lying down.   PERTINENT HISTORY: Asthma, DM2, GERD, GIB, gout, hx MI, HTN Hx lumbar/cervical fusions  PAIN:  Are you having pain: 3-4/10 with medication; at rest 8-7/89; with certain movements 5/10   Per eval:  Location/description: R knee  Best-worst over past week: 4-9/10  - aggravating factors: moving knee, WB, lower body dressing - Easing factors: ice, medication    PRECAUTIONS: fall  WEIGHT BEARING RESTRICTIONS: No  FALLS:  Has patient fallen in last 6 months? 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  DIAGNOSTIC FINDINGS:  R TKA DOS 06/20/24   PATIENT SURVEYS:  LEFS: 11/80    SENSATION: Does not endorse any sensory complaints  EDEMA/INSPECTION:  Incision obscured by bandage which is well-saturated in upper portion (approx 40-50%), no significant erythema, mild bruising about knee as expected ; edema WNL ; no calf pain or swelling, no TTP about calf or pain with DF   LOWER EXTREMITY ROM:      Right eval Left eval R 06/29/24 07/11/24   Hip flexion      Hip extension      Hip internal rotation      Hip external rotation      Knee extension   A: lacking 5-6 deg with heel prop -10 deg Heel propped   Knee flexion AA: 69 deg * P: 80 deg *  AA: 75 deg seated * AAROM supine  98 deg   (Blank rows = not tested) (Key: WFL = within functional limits not formally assessed, * = concordant pain, s =  stiffness/stretching sensation, NT = not tested)  Comments:    LOWER EXTREMITY MMT:    MMT Right eval Left eval  Hip flexion    Hip abduction (modified sitting)    Hip internal rotation    Hip external rotation    Knee flexion    Knee extension    Ankle dorsiflexion     (Blank rows = not tested) (Key: WFL = within functional limits not formally assessed, * = concordant pain, s = stiffness/stretching sensation, NT = not tested)  Comments:  deferred on eval given acuity of surgery   FUNCTIONAL TESTS:  TUG: 24.98sec RW standard chair  06/29/24: - TUG: 12.84sec w/ walking sticks from lowest mat   GAIT: Distance walked: within clinic Assistive device utilized: Walker - 2 wheeled Level of assistance: Modified independence Comments: step to pattern leading with R                                                                                                                                TREATMENT:   [facility] Adult PT Treatment:                                                DATE: 07/11/24 Therapeutic Exercise: Nustep L 5 x 5 min for warm up and ROM using UEs  Hamstring stretch with strap 30 sec x 3  Supine heel slides w/ strap and towel under heel 2x10 RLE  Knee flex/ext AAROM w/ blue foam roller, seated 2x15 Passive knee extension sitting heel propped on floor pt pushing knee into extension x 5   Neuromuscular re-ed: Working on lumbar core activation in supine requiring verbal and tactile cues  Quad set 5 sec x 10 towel behind knee SLR small range 3 sec x 10  SAQ 5 sec x 10  [place]g Hip abduction leading with heel 3 sec x 10 x 2   Therapeutic Activity: Standing with activation of extensor through core, hips, knees   [facility] Adult PT Treatment:                                                DATE: 07/06/24 Therapeutic Exercise: Nustep L 5 x 9 min for warm up and ROM using UEs  LE supported on bolster and pillow for comfort - ankle pumps, circles CW/CCW LE supported on  bolster and pillow for comfort quad set partial range 3 sec x 10  Supine heel slides w/ strap and slider 2x12 RLE  Neuromuscular re-ed: Quad set 5 sec x 10 pillow behind knee SLR small range 3 sec x 10  Therapeutic Activity:   [facility] Adult PT Treatment:                                                DATE: 07/04/24 Therapeutic Exercise: Nustep L 5 x 5 min for warm up and ROM using UEs  Hamstring stretch with strap 30 sec x 3  Knee flexion heels on green therapeutic ball patient assisting with knee flexion with strap 10 sec x 10 R LE  Supine heel slides w/ strap and slider 2x12 RLE  Knee flex/ext AAROM w/ blue foam roller, seated 2x15 Passive knee  extension sitting heel propped on floor pt pushing knee into extension x 5   Neuromuscular re-ed: Quad set 5 sec x 10 towel behind knee SLR small range 3 sec x 10  SAQ 5 sec x 10  [place]g Hip abduction leading with heel 3 sec x 10 x 2  Bosu TKE push 2x10 RLE     Therapeutic Activity: Mini squat at counter w/ UE support  2x10 cues symmetrical WB Standing back to counter straightening hips and knees 5 reps  Side steps at counter ~ 10 feet x 4 R/L    PATIENT EDUCATION:  Education details: rationale for interventions, HEP  Person educated: Patient Education method: Explanation, Demonstration, Tactile cues, Verbal cues Education comprehension: verbalized understanding, returned demonstration, verbal cues required, tactile cues required, and needs further education     HOME EXERCISE PROGRAM: Access Code: [email or web address] URL: [email or web address] Date: 07/11/2024 Prepared by: Laniqua Torrens  Exercises - Seated Quad Set  - 3-4 x daily - 1 sets - 8-10 reps - Seated Heel Slide  - 3-4 x daily - 1 sets - 8-10 reps - Seated Calf Stretch with Strap  - 3-4 x daily - 1 sets - 8 reps - Mini Squat with Counter Support  - 3-4 x daily - 1 sets - 5 reps - Supine Transversus Abdominis Bracing with Pelvic Floor Contraction  - 2 x daily - 7 x weekly  - 1 sets - 10 reps - 10sec  hold - Standing Anatomical Position with Scapular Retraction and Depression at Wall  - 2 x daily - 7 x weekly - 1 sets - 5-10 reps - 5-10 sec  hold  ASSESSMENT:  CLINICAL IMPRESSION: 07/11/2024: patient reports decreased pain and swelling in the R knee since last week. He has had some persistent pain with certain activities. He does report some back pain and he does have a history of LBP. Back pain may be increased due to abnormal gait pattern and sitting/lying down more following surgery. Added core activation exercise to home program to address back pain. Patient tolerated increased exercises today with notable increased AAROM in knee flexion/extension compared to last week. He has been using 2 hiking sticks again at home since the flare up of pain last week. Reviewed the importance of resting the tissue and decreasing pain following flare up of knee pain. Patient tolerated Increased exercise intensity of exercises today. Noted good gains in AAROM R knee today as noted above.   Per eval: Patient is a pleasant 64 y.o. gentleman who was seen today for physical therapy evaluation and treatment for R TKA DOS 06/20/24  He endorses expected post op limitations in ADLs and functional mobility, requiring RW for ambulation. No post op red flags, although upper portion of bandage is observed to be well-saturated (has been in ACE wrap since surgery) - advised pt to communicate w/ surgeon's office re: wound care/dressing management, he states he will call after our session. He demonstrates anticipated deficits in knee ROM, quad activation, and altered kinematics w/ gait/transfers. TUG is indicative of fall risk. No adverse events, tolerates exam/HEP well overall. Self care education as above. Recommend skilled PT to address aforementioned deficits with aim of improving functional tolerance and reducing pain with typical activities. Pt departs today's session in no acute distress, all voiced  concerns/questions addressed appropriately from PT perspective.    OBJECTIVE IMPAIRMENTS: Abnormal gait, decreased activity tolerance, decreased balance, decreased endurance, decreased mobility, difficulty walking, decreased ROM, decreased strength, increased edema, impaired perceived functional ability, impaired  flexibility, improper body mechanics, and pain.    GOALS:  SHORT TERM GOALS: Target date: 07/20/2024  Pt will demonstrate appropriate understanding and performance of initially prescribed HEP in order to facilitate improved independence with management of symptoms.  Baseline: HEP established   Goal status: INITIAL   2. Pt will report at least 25% improvement in overall pain levels over past week in order to facilitate improved tolerance to typical daily activities.   Baseline: 4-9/10  Goal status: INITIAL    LONG TERM GOALS: Target date: 08/17/2024   Pt will score 50/80 or greater on LEFS in order to demonstrate improved perception of function due to symptoms (MCID 9 pts) Baseline: 11/80 Goal status: INITIAL  2.  Pt will demonstrate at least 0-110 degrees of knee AROM on surgical limb in order to facilitate improved tolerance to functional movements such as squatting, walking, and stair navigation.  Baseline: see ROM chart above Goal status: INITIAL  3.  Pt will demonstrate appropriate performance of final prescribed HEP in order to facilitate improved self-management of symptoms post-discharge.   Baseline: initial HEP prescribed   Goal status: INITIAL    4.  Pt will be able to perform TUG in less than or equal to 13 sec with LRAD in order to indicate reduced risk of falling (cutoff score for fall risk 13.5 sec in community dwelling older adults per [facility] et al, 2000)  Baseline: 24.98sec RW  Goal status: INITIAL    5. Pt will report at least 50% decrease in overall pain levels in past week in order to facilitate improved tolerance to basic ADLs/mobility.   Baseline:  4-9/10  Goal status: INITIAL    6. Pt will demonstrate symmetrical knee MMT between surgical and non-surgical limb in order to facilitate improved functional strength and mechanics.  Baseline: deferred on eval given proximity to surgery  Goal status: INITIAL     PLAN:  PT FREQUENCY: 2x/week  PT DURATION: 8 weeks  PLANNED INTERVENTIONS: 97164- PT Re-evaluation, 97750- Physical Performance Testing, 97110-Therapeutic exercises, 97530- Therapeutic activity, [id number]- Neuromuscular re-education, 97535- Self Care, [id number]- Manual therapy, [id number]- Gait training, [phone]- Aquatic Therapy, [phone]- Vasopneumatic device, [phone] (1-2 muscles), [id number] (3+ muscles)- Dry Needling, Patient/Family education, Balance training, Stair training, Taping, Joint mobilization, Scar mobilization, Cryotherapy, and Moist heat  PLAN FOR NEXT SESSION: Review/update HEP PRN. Work on [employer] exercises as appropriate with emphasis on quad activation, knee ext/flex ROM, functional mechanics. Symptom modification strategies as indicated/appropriate. Mindful of MI hx and spinal fusions.   Gaye Scorza P. Ina PT, MPH 07/11/2024 3:31 PM  "

## 2024-07-13 ENCOUNTER — Encounter: Payer: Self-pay | Admitting: Rehabilitative and Restorative Service Providers"

## 2024-07-13 ENCOUNTER — Ambulatory Visit: Admitting: Rehabilitative and Restorative Service Providers"

## 2024-07-13 DIAGNOSIS — R600 Localized edema: Secondary | ICD-10-CM

## 2024-07-13 DIAGNOSIS — M25561 Pain in right knee: Secondary | ICD-10-CM | POA: Diagnosis not present

## 2024-07-13 DIAGNOSIS — R2689 Other abnormalities of gait and mobility: Secondary | ICD-10-CM

## 2024-07-13 DIAGNOSIS — M6281 Muscle weakness (generalized): Secondary | ICD-10-CM

## 2024-07-13 NOTE — Therapy (Signed)
 " OUTPATIENT PHYSICAL THERAPY TREATMENT   Patient Name: Hunter Krause MRN: [id number] DOB:09/13/1960, 64 y.o., male Today's Date: 07/13/2024  END OF SESSION:  PT End of Session - 07/13/24 1532     Visit Number 6    Number of Visits 17    Date for Recertification  08/17/24    Authorization Type [employer]    Authorization Time Period 60VL per year    PT Start Time 1530    PT Stop Time 1615    PT Time Calculation (min) 45 min    Activity Tolerance No increased pain;Patient tolerated treatment well            Past Medical History:  Diagnosis Date   Arthritis    Asthma 1994   allergy induced asthma   Cancer (HCC)    kidney lesion   Coronary artery disease    Diabetes mellitus without complication (HCC) type 2    GERD (gastroesophageal reflux disease)    GI bleed 12/01/2021   admit to [facility]   GI bleed 12/01/2021   admit to [facility]   Gout    no recent flares   History of hiatal hernia    History of kidney stones    Hypertension    treated with hydrocholothiazide   Myocardial infarction (HCC) 2019   Pneumonia    Sleep apnea    treated with surgery. No problems now.   Past Surgical History:  Procedure Laterality Date   ARTHROSCOPY KNEE W/ DRILLING  2004   right knee   Blephroplasty  upper     cervical neck fusion     C 3 to C 5 has plates and screws   CHOLECYSTECTOMY     CORONARY ANGIOPLASTY  2019   stent x 1   CORONARY STENT INTERVENTION     CYSTOSCOPY/URETEROSCOPY/HOLMIUM LASER/STENT PLACEMENT Left 09/10/2020   Procedure: CYSTOSCOPY LEFT URETEROSCOPY/HOLMIUM LASER/STENT PLACEMENT;  Surgeon: Carolee Sherwood JONETTA DOUGLAS, MD;  Location: [facility];  Service: Urology;  Laterality: Left;   CYSTOSCOPY/URETEROSCOPY/HOLMIUM LASER/STENT PLACEMENT Bilateral 05/28/2022   Procedure: CYSTOSCOPY/ RETROGRADE/URETEROSCOPY/HOLMIUM LASER/STENT PLACEMENT;  Surgeon: [name], MD;  Location: [facility];  Service: Urology;  Laterality: Bilateral;    EXTRACORPOREAL SHOCK WAVE LITHOTRIPSY Left 09/03/2020   Procedure: LEFT EXTRACORPOREAL SHOCK WAVE LITHOTRIPSY (ESWL);  Surgeon: Carolee Sherwood JONETTA DOUGLAS, MD;  Location: [facility];  Service: Urology;  Laterality: Left;   IR RADIOLOGIST EVAL & MGMT  09/23/2023   IR RADIOLOGIST EVAL & MGMT  12/22/2023   IR RADIOLOGIST EVAL & MGMT  03/17/2024   LITHOTRIPSY  2001   lower back  2020   fusion    X2    L 4 to L 6   L2 L3   NASAL SEPTUM SURGERY  2005   palate remove for osa   RADIOLOGY WITH ANESTHESIA Right 12/02/2023   Procedure: CT WITH ANESTHESIA;  Surgeon: Radiologist, Medication, MD;  Location: [facility];  Service: Radiology;  Laterality: Right;   TOTAL KNEE ARTHROPLASTY Right 06/20/2024   Procedure: ARTHROPLASTY, KNEE, TOTAL;  Surgeon: Yvone Rush, MD;  Location: [facility];  Service: Orthopedics;  Laterality: Right;   Patient Active Problem List   Diagnosis Date Noted   Osteoarthritis of right knee 06/19/2024    PCP: [name], MD   REFERRING PROVIDER: Yvone Rush, MD   REFERRING DIAG: [phone] (ICD-10-CM) - Presence of right artificial knee joint   THERAPY DIAG:  Right knee pain, unspecified chronicity  Localized edema  Muscle weakness (generalized)  Other abnormalities of gait and mobility  Rationale for Evaluation and Treatment: Rehabilitation  ONSET DATE: R TKA 06/20/24  SUBJECTIVE:  Per eval: Pt endorses hx of chronic knee pain, 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 Since surgery has been receiving assistance from spouse and son for household tasks and lower body dressing. Utilizing RW. Has HEP from hospital. Able to manage steps out of home w/ inc time/difficulty.  Denies calf pain/swelling, no SOB/chest pain, no fevers/chills.   SUBJECTIVE STATEMENT: 07/13/2024: patient states that he had some tightness and soreness in the R knee this morning but it has loosened up now. He may be sleeping in some twisted position. He is icing at home 3-4  times per day. He does have some intermittent back pain which may be related to the way he is sleeping.   PERTINENT HISTORY: Asthma, DM2, GERD, GIB, gout, hx MI, HTN Hx lumbar/cervical fusions  PAIN:  Are you having pain: 3/10 with medication; at rest 1-2/10; this morning 6-7/10   Per eval:  Location/description: R knee  Best-worst over past week: 4-9/10  - aggravating factors: moving knee, WB, lower body dressing - Easing factors: ice, medication    PRECAUTIONS: fall  WEIGHT BEARING RESTRICTIONS: No  FALLS:  Has patient fallen in last 6 months? 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  DIAGNOSTIC FINDINGS:  R TKA DOS 06/20/24   PATIENT SURVEYS:  LEFS: 11/80    SENSATION: Does not endorse any sensory complaints  EDEMA/INSPECTION:  Incision obscured by bandage which is well-saturated in upper portion (approx 40-50%), no significant erythema, mild bruising about knee as expected ; edema WNL ; no calf pain or swelling, no TTP about calf or pain with DF   LOWER EXTREMITY ROM:      Right eval Left eval R 06/29/24 07/11/24   Hip flexion      Hip extension      Hip internal rotation      Hip external rotation      Knee extension   A: lacking 5-6 deg with heel prop -10 deg Heel propped   Knee flexion AA: 69 deg * P: 80 deg *  AA: 75 deg seated * AAROM supine  98 deg   (Blank rows = not tested) (Key: WFL = within functional limits not formally assessed, * = concordant pain, s =  stiffness/stretching sensation, NT = not tested)  Comments:    LOWER EXTREMITY MMT:    MMT Right eval Left eval  Hip flexion    Hip abduction (modified sitting)    Hip internal rotation    Hip external rotation    Knee flexion    Knee extension    Ankle dorsiflexion     (Blank rows = not tested) (Key: WFL = within functional limits not formally assessed, * = concordant pain, s = stiffness/stretching sensation, NT = not tested)  Comments:  deferred on eval given acuity of surgery   FUNCTIONAL TESTS:  TUG: 24.98sec RW  standard chair   06/29/24: - TUG: 12.84sec w/ walking sticks from lowest mat   GAIT: Distance walked: within clinic Assistive device utilized: Walker - 2 wheeled Level of assistance: Modified independence Comments: step to pattern leading with R                                                                                                                                TREATMENT:   [facility] Adult PT Treatment:                                                DATE: 07/13/24 Therapeutic Exercise: Nustep L 5 x 5 min for warm up and ROM using UEs  Hamstring stretch with strap 30 sec x 3  Knee flexion heels on green therapeutic ball patient assisting with knee flexion with strap 10 sec x 10 R LE  Supine heel slides w/ strap and towel 2x12 RLE  Passive knee extension sitting heel propped on floor pt pushing knee into extension x 5   Neuromuscular re-ed: Quad set 5 sec x 10 towel behind knee SLR small range 3 sec x 10  SAQ 5 sec x 10  Sid[place]ihg Hip abduction leading with heel 3 sec x 10 x 2   Therapeutic Activity: TKE ball behind knee 5 sec x 10  Mini squat at counter w/ UE support  2x10 cues symmetrical WB Side steps at counter ~ 10 feet x 3 R/L  Heel lifts at counter x 10    [facility] Adult PT Treatment:                                                DATE: 07/11/24 Therapeutic Exercise: Nustep L 5 x 5 min for warm up and ROM using UEs  Hamstring stretch with  strap 30 sec x 3  Supine heel slides w/ strap and towel under heel 2x10 RLE  Knee flex/ext AAROM w/ blue foam roller, seated 2x15 Passive knee extension sitting heel propped on floor pt pushing knee into extension x 5   Neuromuscular re-ed: Working on lumbar core activation in supine requiring verbal and tactile cues  Quad set 5 sec x 10 towel behind knee SLR small range 3 sec x 10  SAQ 5 sec x 10  Sid[place]ihg Hip abduction leading with heel 3 sec x 10 x 2   Therapeutic Activity: Standing with activation of extensor through core, hips, knees   [facility] Adult PT Treatment:  DATE: 07/06/24 Therapeutic Exercise: Nustep L 5 x 9 min for warm up and ROM using UEs  LE supported on bolster and pillow for comfort - ankle pumps, circles CW/CCW LE supported on bolster and pillow for comfort quad set partial range 3 sec x 10  Supine heel slides w/ strap and slider 2x12 RLE  Neuromuscular re-ed: Quad set 5 sec x 10 pillow behind knee SLR small range 3 sec x 10  Therapeutic Activity:   PATIENT EDUCATION:  Education details: rationale for interventions, HEP  Person educated: Patient Education method: Explanation, Demonstration, Tactile cues, Verbal cues Education comprehension: verbalized understanding, returned demonstration, verbal cues required, tactile cues required, and needs further education     HOME EXERCISE PROGRAM: Access Code: [email or web address] URL: [email or web address] Date: 07/13/2024 Prepared by: Aijah Lattner  Exercises - Seated Quad Set  - 3-4 x daily - 1 sets - 8-10 reps - Seated Heel Slide  - 3-4 x daily - 1 sets - 8-10 reps - Seated Calf Stretch with Strap  - 3-4 x daily - 1 sets - 8 reps - Mini Squat with Counter Support  - 3-4 x daily - 1 sets - 5 reps - Supine Transversus Abdominis Bracing with Pelvic Floor Contraction  - 2 x daily - 7 x weekly - 1 sets - 10 reps - 10sec  hold - Standing Anatomical Position with Scapular  Retraction and Depression at Wall  - 2 x daily - 7 x weekly - 1 sets - 5-10 reps - 5-10 sec  hold - Standing Terminal Knee Extension at Wall with Ball  - 2 x daily - 7 x weekly - 1-2 sets - 10 reps - 5-10 sec  hold  ASSESSMENT:  CLINICAL IMPRESSION: 07/13/2024: patient reports decreased pain and swelling in the R knee with continued improvement in the past couple of days. Notices some increased tightness and discomfort in the knee upon awakening in the morning.  Discussed positions for sleeping and offered suggestions for supporting LE when he is in bed. Note increased R knee ROM today (-) 7 deg extension to 104 deg flexion in supine. Increased exercises as noted adding back standing exercises.   Per eval: Patient is a pleasant 64 y.o. gentleman who was seen today for physical therapy evaluation and treatment for R TKA DOS 06/20/24  He endorses expected post op limitations in ADLs and functional mobility, requiring RW for ambulation. No post op red flags, although upper portion of bandage is observed to be well-saturated (has been in ACE wrap since surgery) - advised pt to communicate w/ surgeon's office re: wound care/dressing management, he states he will call after our session. He demonstrates anticipated deficits in knee ROM, quad activation, and altered kinematics w/ gait/transfers. TUG is indicative of fall risk. No adverse events, tolerates exam/HEP well overall. Self care education as above. Recommend skilled PT to address aforementioned deficits with aim of improving functional tolerance and reducing pain with typical activities. Pt departs today's session in no acute distress, all voiced concerns/questions addressed appropriately from PT perspective.    OBJECTIVE IMPAIRMENTS: Abnormal gait, decreased activity tolerance, decreased balance, decreased endurance, decreased mobility, difficulty walking, decreased ROM, decreased strength, increased edema, impaired perceived functional ability, impaired  flexibility, improper body mechanics, and pain.    GOALS:  SHORT TERM GOALS: Target date: 07/20/2024  Pt will demonstrate appropriate understanding and performance of initially prescribed HEP in order to facilitate improved independence with management of symptoms.  Baseline: HEP established   Goal status: INITIAL  2. Pt will report at least 25% improvement in overall pain levels over past week in order to facilitate improved tolerance to typical daily activities.   Baseline: 4-9/10  Goal status: INITIAL    LONG TERM GOALS: Target date: 08/17/2024   Pt will score 50/80 or greater on LEFS in order to demonstrate improved perception of function due to symptoms (MCID 9 pts) Baseline: 11/80 Goal status: INITIAL  2.  Pt will demonstrate at least 0-110 degrees of knee AROM on surgical limb in order to facilitate improved tolerance to functional movements such as squatting, walking, and stair navigation.  Baseline: see ROM chart above Goal status: INITIAL  3.  Pt will demonstrate appropriate performance of final prescribed HEP in order to facilitate improved self-management of symptoms post-discharge.   Baseline: initial HEP prescribed   Goal status: INITIAL    4.  Pt will be able to perform TUG in less than or equal to 13 sec with LRAD in order to indicate reduced risk of falling (cutoff score for fall risk 13.5 sec in community dwelling older adults per [facility] et al, 2000)  Baseline: 24.98sec RW  Goal status: INITIAL    5. Pt will report at least 50% decrease in overall pain levels in past week in order to facilitate improved tolerance to basic ADLs/mobility.   Baseline: 4-9/10  Goal status: INITIAL    6. Pt will demonstrate symmetrical knee MMT between surgical and non-surgical limb in order to facilitate improved functional strength and mechanics.  Baseline: deferred on eval given proximity to surgery  Goal status: INITIAL     PLAN:  PT FREQUENCY: 2x/week  PT DURATION:  8 weeks  PLANNED INTERVENTIONS: 97164- PT Re-evaluation, 97750- Physical Performance Testing, 97110-Therapeutic exercises, 97530- Therapeutic activity, [id number]- Neuromuscular re-education, 97535- Self Care, [id number]- Manual therapy, [id number]- Gait training, [phone]- Aquatic Therapy, [phone]- Vasopneumatic device, [phone] (1-2 muscles), [id number] (3+ muscles)- Dry Needling, Patient/Family education, Balance training, Stair training, Taping, Joint mobilization, Scar mobilization, Cryotherapy, and Moist heat  PLAN FOR NEXT SESSION: Review/update HEP PRN. Work on [employer] exercises as appropriate with emphasis on quad activation, knee ext/flex ROM, functional mechanics. Symptom modification strategies as indicated/appropriate. Mindful of MI hx and spinal fusions. F/U with sleeping positions to assess response to change in sleeping positions.    Verna Desrocher P. Ina PT, MPH 07/13/2024 3:33 PM  "

## 2024-07-18 ENCOUNTER — Ambulatory Visit

## 2024-07-18 DIAGNOSIS — M25561 Pain in right knee: Secondary | ICD-10-CM

## 2024-07-18 DIAGNOSIS — R600 Localized edema: Secondary | ICD-10-CM

## 2024-07-18 DIAGNOSIS — R2689 Other abnormalities of gait and mobility: Secondary | ICD-10-CM

## 2024-07-18 DIAGNOSIS — M6281 Muscle weakness (generalized): Secondary | ICD-10-CM

## 2024-07-18 NOTE — Therapy (Signed)
 " OUTPATIENT PHYSICAL THERAPY TREATMENT   Patient Name: Hunter Krause MRN: [id number] DOB:Oct 09, 1960, 64 y.o., male Today's Date: 07/18/2024  END OF SESSION:  PT End of Session - 07/18/24 1528     Visit Number 7    Number of Visits 17    Date for Recertification  08/17/24    Authorization Type BC[employer]    Authorization Time Period 60VL per year    PT Start Time 1532    PT Stop Time 1614    PT Time Calculation (min) 42 min    Activity Tolerance Patient tolerated treatment well    Behavior During Therapy [facility] for tasks assessed/performed         Past Medical History:  Diagnosis Date   Arthritis    Asthma 1994   allergy induced asthma   Cancer (HCC)    kidney lesion   Coronary artery disease    Diabetes mellitus without complication (HCC) type 2    GERD (gastroesophageal reflux disease)    GI bleed 12/01/2021   admit to [facility]   GI bleed 12/01/2021   admit to [facility]   Gout    no recent flares   History of hiatal hernia    History of kidney stones    Hypertension    treated with hydrocholothiazide   Myocardial infarction (HCC) 2019   Pneumonia    Sleep apnea    treated with surgery. No problems now.   Past Surgical History:  Procedure Laterality Date   ARTHROSCOPY KNEE W/ DRILLING  2004   right knee   Blephroplasty  upper     cervical neck fusion     C 3 to C 5 has plates and screws   CHOLECYSTECTOMY     CORONARY ANGIOPLASTY  2019   stent x 1   CORONARY STENT INTERVENTION     CYSTOSCOPY/URETEROSCOPY/HOLMIUM LASER/STENT PLACEMENT Left 09/10/2020   Procedure: CYSTOSCOPY LEFT URETEROSCOPY/HOLMIUM LASER/STENT PLACEMENT;  Surgeon: Carolee Sherwood JONETTA DOUGLAS, MD;  Location: [facility];  Service: Urology;  Laterality: Left;   CYSTOSCOPY/URETEROSCOPY/HOLMIUM LASER/STENT PLACEMENT Bilateral 05/28/2022   Procedure: CYSTOSCOPY/ RETROGRADE/URETEROSCOPY/HOLMIUM LASER/STENT PLACEMENT;  Surgeon: [name], MD;  Location: [facility];  Service: Urology;   Laterality: Bilateral;   EXTRACORPOREAL SHOCK WAVE LITHOTRIPSY Left 09/03/2020   Procedure: LEFT EXTRACORPOREAL SHOCK WAVE LITHOTRIPSY (ESWL);  Surgeon: Carolee Sherwood JONETTA DOUGLAS, MD;  Location: [facility];  Service: Urology;  Laterality: Left;   IR RADIOLOGIST EVAL & MGMT  09/23/2023   IR RADIOLOGIST EVAL & MGMT  12/22/2023   IR RADIOLOGIST EVAL & MGMT  03/17/2024   LITHOTRIPSY  2001   lower back  2020   fusion    X2    L 4 to L 6   L2 L3   NASAL SEPTUM SURGERY  2005   palate remove for osa   RADIOLOGY WITH ANESTHESIA Right 12/02/2023   Procedure: CT WITH ANESTHESIA;  Surgeon: Radiologist, Medication, MD;  Location: [facility];  Service: Radiology;  Laterality: Right;   TOTAL KNEE ARTHROPLASTY Right 06/20/2024   Procedure: ARTHROPLASTY, KNEE, TOTAL;  Surgeon: Yvone Rush, MD;  Location: [facility];  Service: Orthopedics;  Laterality: Right;   Patient Active Problem List   Diagnosis Date Noted   Osteoarthritis of right knee 06/19/2024    PCP: [name], MD   REFERRING PROVIDER: Yvone Rush, MD   REFERRING DIAG: [phone] (ICD-10-CM) - Presence of right artificial knee joint   THERAPY DIAG:  Right knee pain, unspecified chronicity  Localized edema  Muscle weakness (generalized)  Other abnormalities of gait and mobility  Rationale for Evaluation and Treatment: Rehabilitation  ONSET DATE: R TKA 06/20/24  SUBJECTIVE:  Per eval: Pt endorses hx of chronic knee pain, 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 Since surgery has been receiving assistance from spouse and son for household tasks and lower body dressing. Utilizing RW. Has HEP from hospital. Able to manage steps out of home w/ inc time/difficulty.  Denies calf pain/swelling, no SOB/chest pain, no fevers/chills.   SUBJECTIVE STATEMENT: Patient reports he knee continues to feel tight but it is bending better; straightening is still challenging.   PERTINENT HISTORY: Asthma, DM2, GERD, GIB,  gout, hx MI, HTN Hx lumbar/cervical fusions  PAIN:  Are you having pain: 3/10 with medication; at rest 1-2/10; this morning 6-7/10   Per eval:  Location/description: R knee  Best-worst over past week: 4-9/10  - aggravating factors: moving knee, WB, lower body dressing - Easing factors: ice, medication    PRECAUTIONS: fall  WEIGHT BEARING RESTRICTIONS: No  FALLS:  Has patient fallen in last 6 months? 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  DIAGNOSTIC FINDINGS:  R TKA DOS 06/20/24   PATIENT SURVEYS:  LEFS: 11/80    SENSATION: Does not endorse any sensory complaints  EDEMA/INSPECTION:  Incision obscured by bandage which is well-saturated in upper portion (approx 40-50%), no significant erythema, mild bruising about knee as expected ; edema WNL ; no calf pain or swelling, no TTP about calf or pain with DF   LOWER EXTREMITY ROM:      Right eval Left eval R 06/29/24 07/11/24  07/18/24  Hip flexion       Hip extension       Hip internal rotation       Hip external rotation       Knee extension   A: lacking 5-6 deg with heel prop -10 deg Heel propped    Knee flexion AA: 69 deg * P: 80 deg *  AA: 75 deg seated * AAROM supine  98 deg  AAROM supine 105  (Blank rows = not tested) (Key: WFL = within functional limits not formally assessed, * = concordant pain, s = stiffness/stretching sensation, NT = not tested)  Comments:    LOWER EXTREMITY MMT:    MMT Right eval Left eval  Hip flexion     Hip abduction (modified sitting)    Hip internal rotation    Hip external rotation    Knee flexion    Knee extension    Ankle dorsiflexion     (Blank rows = not tested) (Key: WFL = within functional limits not formally assessed, * = concordant pain, s = stiffness/stretching sensation, NT = not tested)  Comments:  deferred on eval given acuity of surgery   FUNCTIONAL TESTS:  TUG: 24.98sec RW standard chair   06/29/24: - TUG: 12.84sec w/ walking sticks from lowest mat   GAIT: Distance walked: within clinic Assistive device utilized: Environmental Consultant -  2 wheeled Level of assistance: Modified independence Comments: step to pattern leading with R                                                                                                                               TREATMENT:  [facility] Adult PT Treatment:                                                DATE: 07/18/2024 Therapeutic Exercise: NuStep L5 x 5 min + subjective intake Seated HS stretch Neuromuscular re-ed: Supine:  Quad set with towel roll behind knee 10 x 5 sec SAQ with bolster 12 x 3 sec Small range SLR 10 x 3 sec Seated TKE on bosu ball 10 x 5 sec Seated small range SLR 10 x 3 sec Standing TKE with ball x10 --> second set with blue TB (felt better on knee) Therapeutic Activity: Supine heel slide with orange PB & assist from strap Seated knee flexion with slider 2x10 with stretch at end range 4 step up leading with Rt leg x 5 4 step stair navigating with step through pattern 6 step up leading with Rt leg 6 step stair navigation with step through pattern --> tight on Rt with descend 4 step down x 8    [facility] Adult PT Treatment:                                                DATE: 07/13/24 Therapeutic Exercise: Nustep L 5 x 5 min for warm up and ROM using UEs  Hamstring stretch with strap 30 sec x 3  Knee flexion heels on green therapeutic ball patient assisting with knee flexion with strap 10 sec x 10 R LE   Supine heel slides w/ strap and towel 2x12 RLE  Passive knee extension sitting heel propped on floor pt pushing knee into extension x 5   Neuromuscular re-ed: Quad set 5 sec x 10 towel behind knee SLR small range 3 sec x 10  SAQ 5 sec x 10  [place] Hip abduction leading with heel 3 sec x 10 x 2   Therapeutic Activity: TKE ball behind knee 5 sec x 10  Mini squat at counter w/ UE support  2x10 cues symmetrical WB Side steps at counter ~ 10 feet x 3 R/L  Heel lifts at counter x 10    [facility] Adult PT Treatment:                                                DATE: 07/11/24  Therapeutic Exercise: Nustep L 5 x 5 min for warm up and ROM using UEs  Hamstring stretch with strap 30 sec x 3  Supine heel slides w/ strap and towel under heel 2x10 RLE  Knee flex/ext AAROM w/ blue foam roller, seated 2x15 Passive knee extension sitting heel propped on floor pt pushing knee into extension x 5   Neuromuscular re-ed: Working on lumbar core activation in supine requiring verbal and tactile cues  Quad set 5 sec x 10 towel behind knee SLR small range 3 sec x 10  SAQ 5 sec x 10  S[place]g Hip abduction leading with heel 3 sec x 10 x 2   Therapeutic Activity: Standing with activation of extensor through core, hips, knees     PATIENT EDUCATION:  Education details: terminal knee extension variation  Person educated: Patient Education method: Explanation, Demonstration, Tactile cues, Verbal cues Education comprehension: verbalized understanding, returned demonstration, verbal cues required, tactile cues required, and needs further education     HOME EXERCISE PROGRAM: Access Code: [email or web address] URL: [email or web address] Date: 07/18/2024 Prepared by: Lamarr Price  Exercises - Seated Quad Set  - 3-4 x daily - 1 sets - 8-10 reps - Seated Heel Slide  - 3-4 x daily - 1 sets - 8-10 reps - Seated Calf Stretch with Strap  - 3-4 x daily - 1 sets - 8 reps - Mini Squat with Counter Support  -  3-4 x daily - 1 sets - 5 reps - Supine Transversus Abdominis Bracing with Pelvic Floor Contraction  - 2 x daily - 7 x weekly - 1 sets - 10 reps - 10sec  hold - Standing Anatomical Position with Scapular Retraction and Depression at Wall  - 2 x daily - 7 x weekly - 1 sets - 5-10 reps - 5-10 sec  hold - Standing Terminal Knee Extension with Resistance  - 1 x daily - 7 x weekly - 1-2 sets - 10 reps - 5-10 sec hold  ASSESSMENT:  CLINICAL IMPRESSION: Continued quad strengthening as tolerated and progressing total knee extension; provided modification for standing terminal knee extension exercise to alleviate joint pain/discomfort. Worked on stair navigation with step through pattern as tolerated; noted tightness in Rt knee when ascending standard height steps. Incorporated step down leading with Lt foot on low step.    Per eval: Patient is a pleasant 64 y.o. gentleman who was seen today for physical therapy evaluation and treatment for R TKA DOS 06/20/24  He endorses expected post op limitations in ADLs and functional mobility, requiring RW for ambulation. No post op red flags, although upper portion of bandage is observed to be well-saturated (has been in ACE wrap since surgery) - advised pt to communicate w/ surgeon's office re: wound care/dressing management, he states he will call after our session. He demonstrates anticipated deficits in knee ROM, quad activation, and altered kinematics w/ gait/transfers. TUG is indicative of fall risk. No adverse events, tolerates exam/HEP well overall. Self care education as above. Recommend skilled PT to address aforementioned deficits with aim of improving functional tolerance and reducing pain with typical activities. Pt departs today's session in no acute distress, all voiced concerns/questions addressed appropriately from PT perspective.    OBJECTIVE IMPAIRMENTS: Abnormal gait, decreased activity tolerance, decreased balance, decreased endurance, decreased  mobility, difficulty walking, decreased ROM, decreased strength, increased edema, impaired perceived functional ability, impaired flexibility, improper body mechanics, and pain.    GOALS:  SHORT TERM GOALS: Target date: 07/20/2024  Pt will demonstrate appropriate understanding and  performance of initially prescribed HEP in order to facilitate improved independence with management of symptoms.  Baseline: HEP established   Goal status: INITIAL   2. Pt will report at least 25% improvement in overall pain levels over past week in order to facilitate improved tolerance to typical daily activities.   Baseline: 4-9/10  Goal status: INITIAL    LONG TERM GOALS: Target date: 08/17/2024   Pt will score 50/80 or greater on LEFS in order to demonstrate improved perception of function due to symptoms (MCID 9 pts) Baseline: 11/80 Goal status: INITIAL  2.  Pt will demonstrate at least 0-110 degrees of knee AROM on surgical limb in order to facilitate improved tolerance to functional movements such as squatting, walking, and stair navigation.  Baseline: see ROM chart above Goal status: INITIAL  3.  Pt will demonstrate appropriate performance of final prescribed HEP in order to facilitate improved self-management of symptoms post-discharge.   Baseline: initial HEP prescribed   Goal status: INITIAL    4.  Pt will be able to perform TUG in less than or equal to 13 sec with LRAD in order to indicate reduced risk of falling (cutoff score for fall risk 13.5 sec in community dwelling older adults per [facility] et al, 2000)  Baseline: 24.98sec RW  Goal status: INITIAL    5. Pt will report at least 50% decrease in overall pain levels in past week in order to facilitate improved tolerance to basic ADLs/mobility.   Baseline: 4-9/10  Goal status: INITIAL    6. Pt will demonstrate symmetrical knee MMT between surgical and non-surgical limb in order to facilitate improved functional strength and  mechanics.  Baseline: deferred on eval given proximity to surgery  Goal status: INITIAL     PLAN:  PT FREQUENCY: 2x/week  PT DURATION: 8 weeks  PLANNED INTERVENTIONS: 97164- PT Re-evaluation, 97750- Physical Performance Testing, 97110-Therapeutic exercises, 97530- Therapeutic activity, [id number]- Neuromuscular re-education, 97535- Self Care, [id number]- Manual therapy, [id number]- Gait training, [phone]- Aquatic Therapy, [phone]- Vasopneumatic device, [phone] (1-2 muscles), 20561 (3+ muscles)- Dry Needling, Patient/Family education, Balance training, Stair training, Taping, Joint mobilization, Scar mobilization, Cryotherapy, and Moist heat  PLAN FOR NEXT SESSION: Review/update HEP PRN. Work on [employer] exercises as appropriate with emphasis on quad activation, knee ext/flex ROM, functional mechanics. Symptom modification strategies as indicated/appropriate. Mindful of MI hx and spinal fusions. Print TKE variation with blue TB (added to HEP)   Lamarr Price, PTA 07/18/24 4:15 PM  "

## 2024-07-20 ENCOUNTER — Ambulatory Visit: Admitting: Rehabilitative and Restorative Service Providers"

## 2024-07-20 ENCOUNTER — Encounter: Payer: Self-pay | Admitting: Rehabilitative and Restorative Service Providers"

## 2024-07-20 DIAGNOSIS — M25561 Pain in right knee: Secondary | ICD-10-CM

## 2024-07-20 DIAGNOSIS — M6281 Muscle weakness (generalized): Secondary | ICD-10-CM

## 2024-07-20 DIAGNOSIS — R600 Localized edema: Secondary | ICD-10-CM

## 2024-07-20 DIAGNOSIS — R2689 Other abnormalities of gait and mobility: Secondary | ICD-10-CM

## 2024-07-20 NOTE — Therapy (Signed)
 " OUTPATIENT PHYSICAL THERAPY TREATMENT   Patient Name: Hunter Krause MRN: [id number] DOB:21-Mar-1961, 64 y.o., male Today's Date: 07/20/2024  END OF SESSION:  PT End of Session - 07/20/24 1527     Visit Number 8    Number of Visits 17    Date for Recertification  08/17/24    Authorization Type BC[employer]    Authorization Time Period 60VL per year    PT Start Time 1528    PT Stop Time 1620    PT Time Calculation (min) 52 min         Past Medical History:  Diagnosis Date   Arthritis    Asthma 1994   allergy induced asthma   Cancer (HCC)    kidney lesion   Coronary artery disease    Diabetes mellitus without complication (HCC) type 2    GERD (gastroesophageal reflux disease)    GI bleed 12/01/2021   admit to [facility]   GI bleed 12/01/2021   admit to [facility]   Gout    no recent flares   History of hiatal hernia    History of kidney stones    Hypertension    treated with hydrocholothiazide   Myocardial infarction (HCC) 2019   Pneumonia    Sleep apnea    treated with surgery. No problems now.   Past Surgical History:  Procedure Laterality Date   ARTHROSCOPY KNEE W/ DRILLING  2004   right knee   Blephroplasty  upper     cervical neck fusion     C 3 to C 5 has plates and screws   CHOLECYSTECTOMY     CORONARY ANGIOPLASTY  2019   stent x 1   CORONARY STENT INTERVENTION     CYSTOSCOPY/URETEROSCOPY/HOLMIUM LASER/STENT PLACEMENT Left 09/10/2020   Procedure: CYSTOSCOPY LEFT URETEROSCOPY/HOLMIUM LASER/STENT PLACEMENT;  Surgeon: Carolee Sherwood JONETTA DOUGLAS, MD;  Location: [facility];  Service: Urology;  Laterality: Left;   CYSTOSCOPY/URETEROSCOPY/HOLMIUM LASER/STENT PLACEMENT Bilateral 05/28/2022   Procedure: CYSTOSCOPY/ RETROGRADE/URETEROSCOPY/HOLMIUM LASER/STENT PLACEMENT;  Surgeon: [name], MD;  Location: [facility];  Service: Urology;  Laterality: Bilateral;   EXTRACORPOREAL SHOCK WAVE LITHOTRIPSY Left 09/03/2020   Procedure: LEFT EXTRACORPOREAL  SHOCK WAVE LITHOTRIPSY (ESWL);  Surgeon: Carolee Sherwood JONETTA DOUGLAS, MD;  Location: [facility];  Service: Urology;  Laterality: Left;   IR RADIOLOGIST EVAL & MGMT  09/23/2023   IR RADIOLOGIST EVAL & MGMT  12/22/2023   IR RADIOLOGIST EVAL & MGMT  03/17/2024   LITHOTRIPSY  2001   lower back  2020   fusion    X2    L 4 to L 6   L2 L3   NASAL SEPTUM SURGERY  2005   palate remove for osa   RADIOLOGY WITH ANESTHESIA Right 12/02/2023   Procedure: CT WITH ANESTHESIA;  Surgeon: Radiologist, Medication, MD;  Location: [facility];  Service: Radiology;  Laterality: Right;   TOTAL KNEE ARTHROPLASTY Right 06/20/2024   Procedure: ARTHROPLASTY, KNEE, TOTAL;  Surgeon: Yvone Rush, MD;  Location: [facility];  Service: Orthopedics;  Laterality: Right;   Patient Active Problem List   Diagnosis Date Noted   Osteoarthritis of right knee 06/19/2024    PCP: [name], MD   REFERRING PROVIDER: Yvone Rush, MD   REFERRING DIAG: [phone] (ICD-10-CM) - Presence of right artificial knee joint   THERAPY DIAG:  Right knee pain, unspecified chronicity  Localized edema  Muscle weakness (generalized)  Other abnormalities of gait and mobility  Rationale for Evaluation and Treatment: Rehabilitation  ONSET DATE: R TKA 06/20/24  SUBJECTIVE:  Per eval: Pt endorses hx of chronic knee pain, 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 Since surgery has been receiving assistance from spouse and son for household tasks and lower body dressing. Utilizing RW. Has HEP from hospital. Able to manage steps out of home w/ inc time/difficulty.  Denies calf pain/swelling, no SOB/chest pain, no fevers/chills.   SUBJECTIVE STATEMENT: Patient reports that he has problems with straightening. Seems to be bending better than straightening. Can tll he is making progress.   PERTINENT HISTORY: Asthma, DM2, GERD, GIB, gout, hx MI, HTN Hx lumbar/cervical fusions  PAIN:  Are you having pain: 4/10 with  medication; at rest 1-2/10; this morning 6-7/10   Per eval:  Location/description: R knee  Best-worst over past week: 4-9/10  - aggravating factors: moving knee, WB, lower body dressing - Easing factors: ice, medication    PRECAUTIONS: fall  WEIGHT BEARING RESTRICTIONS: No  FALLS:  Has patient fallen in last 6 months? 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  DIAGNOSTIC FINDINGS:  R TKA DOS 06/20/24   PATIENT SURVEYS:  LEFS: 11/80    SENSATION: Does not endorse any sensory complaints  EDEMA/INSPECTION:  Incision obscured by bandage which is well-saturated in upper portion (approx 40-50%), no significant erythema, mild bruising about knee as expected ; edema WNL ; no calf pain or swelling, no TTP about calf or pain with DF   LOWER EXTREMITY ROM:      Right eval Left eval R 06/29/24 07/11/24  07/18/24 07/20/24  Hip flexion        Hip extension        Hip internal rotation        Hip external rotation        Knee extension   A: lacking 5-6 deg with heel prop -10 deg Heel propped   -8-9  With heel propped   Knee flexion AA: 69 deg * P: 80 deg *  AA: 75 deg seated * AAROM supine  98 deg  AAROM supine 105   (Blank rows = not tested) (Key: WFL = within functional limits not formally assessed, * = concordant pain, s = stiffness/stretching sensation, NT = not tested)  Comments:    LOWER EXTREMITY MMT:    MMT Right eval Left eval  Hip flexion    Hip abduction (modified sitting)    Hip  internal rotation    Hip external rotation    Knee flexion    Knee extension    Ankle dorsiflexion     (Blank rows = not tested) (Key: WFL = within functional limits not formally assessed, * = concordant pain, s = stiffness/stretching sensation, NT = not tested)  Comments:  deferred on eval given acuity of surgery   FUNCTIONAL TESTS:  TUG: 24.98sec RW standard chair   06/29/24: - TUG: 12.84sec w/ walking sticks from lowest mat   GAIT: Distance walked: within clinic Assistive device utilized: Environmental Consultant - 2 wheeled  Level of assistance: Modified independence Comments: step to pattern leading with R                                                                                                                               TREATMENT:  [facility] Adult PT Treatment:                                                DATE: 07/20/2024 Therapeutic Exercise: Recumbent bike backward revolutions x 2 min; forward full revolutions x 4 min  Seated HS stretch Neuromuscular re-ed: Supine:  Quad set with deflated ball behind knee 10 x 5 sec SAQ with bolster 12 x 3 sec Small range SLR 10 x 3 sec Small range SLR in ER 10 x 3 sec  TKE on orange ball 10 x 5 sec Standing TKE with ball x10 --> second set with blue TB  Therapeutic Activity: Supine heel slide with orange PB & assist from strap Seated knee flexion with slider 2x10 with stretch at end range 4 step up leading with Rt leg x 10 4 step stair navigating with step through pattern 4 lateral step tap down with L x 10  6 step up leading with Rt leg x 10  Alternate toe tap to 6 inch step x 20  Calf stretch R 30 sec x 2 gastroc knee straight; soleus stretch with knee bent 30 sec x 2  Mini lunge 10 sec x 5 R foot fwd  Hip abduction leading with heel R/L x 10  Side steps at counter R/L x 4 each Heel toe/backward walking at counter x 4 each  Modalities: Vaso 34 deg low pressure x 10 min to address soreness and pain    [facility] Adult PT Treatment:                                                 DATE: 07/18/2024 Therapeutic Exercise: NuStep L5 x 5 min + subjective intake Seated HS stretch Neuromuscular re-ed: Supine:  Quad set with towel roll behind knee 10 x 5 sec SAQ with bolster 12 x 3 sec Small range SLR 10 x 3 sec Seated TKE on bosu ball 10 x 5 sec Seated small range SLR 10 x 3 sec Standing TKE with ball x10 --> second set with blue TB (felt better on knee) Therapeutic Activity: Supine heel slide with orange PB & assist from strap Seated knee flexion with slider 2x10 with stretch at end range 4 step up leading with Rt leg x 5 4 step stair navigating with step through pattern 6 step up leading with Rt leg 6 step stair navigation with step through pattern --> tight on  Rt with descend 4 step down x 8    [facility] Adult PT Treatment:                                                DATE: 07/13/24 Therapeutic Exercise: Nustep L 5 x 5 min for warm up and ROM using UEs  Hamstring stretch with strap 30 sec x 3  Knee flexion heels on green therapeutic ball patient assisting with knee flexion with strap 10 sec x 10 R LE  Supine heel slides w/ strap and towel 2x12 RLE  Passive knee extension sitting heel propped on floor pt pushing knee into extension x 5   Neuromuscular re-ed: Quad set 5 sec x 10 towel behind knee SLR small range 3 sec x 10  SAQ 5 sec x 10  Sid[place]ihg Hip abduction leading with heel 3 sec x 10 x 2   Therapeutic Activity: TKE ball behind knee 5 sec x 10  Mini squat at counter w/ UE support  2x10 cues symmetrical WB Side steps at counter ~ 10 feet x 3 R/L  Heel lifts at counter x 10    [facility] Adult PT Treatment:                                                DATE: 07/11/24 Therapeutic Exercise: Nustep L 5 x 5 min for warm up and ROM using UEs  Hamstring stretch with strap 30 sec x 3  Supine heel slides w/ strap and towel under heel 2x10 RLE  Knee flex/ext AAROM w/ blue foam roller, seated 2x15 Passive knee  extension sitting heel propped on floor pt pushing knee into extension x 5   Neuromuscular re-ed: Working on lumbar core activation in supine requiring verbal and tactile cues  Quad set 5 sec x 10 towel behind knee SLR small range 3 sec x 10  SAQ 5 sec x 10  Sidelyihg Hip abduction leading with heel 3 sec x 10 x 2   Therapeutic Activity: Standing with activation of extensor through core, hips, knees     PATIENT EDUCATION:  Education details: terminal knee extension variation  Person educated: Patient Education method: Explanation, Demonstration, Tactile cues, Verbal cues Education comprehension: verbalized understanding, returned demonstration, verbal cues required, tactile cues required, and needs further education     HOME EXERCISE PROGRAM: Access Code: [email or web address] URL: [email or web address] Date: 07/20/2024 Prepared by: Kennesha Brewbaker  Exercises - Seated Quad Set  - 3-4 x daily - 1 sets - 8-10 reps - Seated Heel Slide  - 3-4 x daily - 1 sets - 8-10 reps - Seated Calf Stretch with Strap  - 3-4 x daily - 1 sets - 8 reps - Mini Squat with Counter Support  - 3-4 x daily - 1 sets - 5 reps - Supine Transversus Abdominis Bracing with Pelvic Floor Contraction  - 2 x daily - 7 x weekly - 1 sets - 10 reps - 10sec  hold - Standing Anatomical Position with Scapular Retraction and Depression at Wall  - 2 x daily - 7 x weekly - 1 sets - 5-10 reps - 5-10 sec  hold - Standing Terminal Knee Extension with Resistance  - 1 x daily - 7 x weekly -  1-2 sets - 10 reps - 5-10 sec hold - Gastroc Stretch on Wall  - 1 x daily - 7 x weekly - 1 sets - 3 reps - 30 sec  hold - Standing Soleus Stretch  - 1 x daily - 7 x weekly - 1 sets - 3 reps - 30 sec  hold  ASSESSMENT:  CLINICAL IMPRESSION: Patient able to achieve full revolutions on recumbent bike today. Continued to work on AROM to [employer] and strengthening R knee/LE. Working on terminal knee extension. Working standing exercises including.  Patient continues to progress well toward rehab goals. Use of vaso to address DOMS(delayed onset uscle soreness).    Per eval: Patient is a pleasant 64 y.o. gentleman who was seen today for physical therapy evaluation and treatment for R TKA DOS 06/20/24  He endorses expected post op limitations in ADLs and functional mobility, requiring RW for ambulation. No post op red flags, although upper portion of bandage is observed to be well-saturated (has been in ACE wrap since surgery) - advised pt to communicate w/ surgeon's office re: wound care/dressing management, he states he will call after our session. He demonstrates anticipated deficits in knee ROM, quad activation, and altered kinematics w/ gait/transfers. TUG is indicative of fall risk. No adverse events, tolerates exam/HEP well overall. Self care education as above. Recommend skilled PT to address aforementioned deficits with aim of improving functional tolerance and reducing pain with typical activities. Pt departs today's session in no acute distress, all voiced concerns/questions addressed appropriately from PT perspective.    OBJECTIVE IMPAIRMENTS: Abnormal gait, decreased activity tolerance, decreased balance, decreased endurance, decreased mobility, difficulty walking, decreased ROM, decreased strength, increased edema, impaired perceived functional ability, impaired flexibility, improper body mechanics, and pain.    GOALS:  SHORT TERM GOALS: Target date: 07/20/2024  Pt will demonstrate appropriate understanding and performance of initially prescribed HEP in order to facilitate improved independence with management of symptoms.  Baseline: HEP established   Goal status: INITIAL   2. Pt will report at least 25% improvement in overall pain levels over past week in order to facilitate improved tolerance to typical daily activities.   Baseline: 4-9/10  Goal status: INITIAL    LONG TERM GOALS: Target date: 08/17/2024   Pt will score 50/80  or greater on LEFS in order to demonstrate improved perception of function due to symptoms (MCID 9 pts) Baseline: 11/80 Goal status: INITIAL  2.  Pt will demonstrate at least 0-110 degrees of knee AROM on surgical limb in order to facilitate improved tolerance to functional movements such as squatting, walking, and stair navigation.  Baseline: see ROM chart above Goal status: INITIAL  3.  Pt will demonstrate appropriate performance of final prescribed HEP in order to facilitate improved self-management of symptoms post-discharge.   Baseline: initial HEP prescribed   Goal status: INITIAL    4.  Pt will be able to perform TUG in less than or equal to 13 sec with LRAD in order to indicate reduced risk of falling (cutoff score for fall risk 13.5 sec in community dwelling older adults per [facility] et al, 2000)  Baseline: 24.98sec RW  Goal status: INITIAL    5. Pt will report at least 50% decrease in overall pain levels in past week in order to facilitate improved tolerance to basic ADLs/mobility.   Baseline: 4-9/10  Goal status: INITIAL    6. Pt will demonstrate symmetrical knee MMT between surgical and non-surgical limb in order to facilitate improved functional strength and  mechanics.  Baseline: deferred on eval given proximity to surgery  Goal status: INITIAL     PLAN:  PT FREQUENCY: 2x/week  PT DURATION: 8 weeks  PLANNED INTERVENTIONS: 97164- PT Re-evaluation, 97750- Physical Performance Testing, 97110-Therapeutic exercises, 97530- Therapeutic activity, [id number]- Neuromuscular re-education, 97535- Self Care, [id number]- Manual therapy, [id number]- Gait training, [phone]- Aquatic Therapy, [phone]- Vasopneumatic device, [phone] (1-2 muscles), [phone] (3+ muscles)- Dry Needling, Patient/Family education, Balance training, Stair training, Taping, Joint mobilization, Scar mobilization, Cryotherapy, and Moist heat  PLAN FOR NEXT SESSION: Review/update HEP PRN. Work on [employer] exercises as appropriate  with emphasis on quad activation, knee ext/flex ROM, functional mechanics. Symptom modification strategies as indicated/appropriate. Mindful of MI hx and spinal fusions. Print TKE variation with blue TB (added to HEP)  Denishia Citro P. Ina PT, MPH 07/20/24 4:24 PM   "

## 2024-07-25 ENCOUNTER — Ambulatory Visit: Admitting: Rehabilitative and Restorative Service Providers"

## 2024-07-27 ENCOUNTER — Ambulatory Visit: Admitting: Rehabilitative and Restorative Service Providers"

## 2024-07-27 ENCOUNTER — Encounter: Payer: Self-pay | Admitting: Rehabilitative and Restorative Service Providers"

## 2024-07-27 DIAGNOSIS — M25561 Pain in right knee: Secondary | ICD-10-CM

## 2024-07-27 DIAGNOSIS — R2689 Other abnormalities of gait and mobility: Secondary | ICD-10-CM

## 2024-07-27 DIAGNOSIS — M6281 Muscle weakness (generalized): Secondary | ICD-10-CM

## 2024-07-27 DIAGNOSIS — R600 Localized edema: Secondary | ICD-10-CM

## 2024-07-27 NOTE — Therapy (Signed)
 " OUTPATIENT PHYSICAL THERAPY TREATMENT   Patient Name: Hunter Krause MRN: [id number] DOB:1960-10-28, 64 y.o., male Today's Date: 07/27/2024  END OF SESSION:  PT End of Session - 07/27/24 1538     Visit Number 9    Number of Visits 17    Date for Recertification  08/17/24    Authorization Type [employer]    Authorization Time Period 60VL per year    PT Start Time 1533    PT Stop Time 1625    PT Time Calculation (min) 52 min    Activity Tolerance Patient tolerated treatment well         Past Medical History:  Diagnosis Date   Arthritis    Asthma 1994   allergy induced asthma   Cancer (HCC)    kidney lesion   Coronary artery disease    Diabetes mellitus without complication (HCC) type 2    GERD (gastroesophageal reflux disease)    GI bleed 12/01/2021   admit to [facility]   GI bleed 12/01/2021   admit to [facility]   Gout    no recent flares   History of hiatal hernia    History of kidney stones    Hypertension    treated with hydrocholothiazide   Myocardial infarction (HCC) 2019   Pneumonia    Sleep apnea    treated with surgery. No problems now.   Past Surgical History:  Procedure Laterality Date   ARTHROSCOPY KNEE W/ DRILLING  2004   right knee   Blephroplasty  upper     cervical neck fusion     C 3 to C 5 has plates and screws   CHOLECYSTECTOMY     CORONARY ANGIOPLASTY  2019   stent x 1   CORONARY STENT INTERVENTION     CYSTOSCOPY/URETEROSCOPY/HOLMIUM LASER/STENT PLACEMENT Left 09/10/2020   Procedure: CYSTOSCOPY LEFT URETEROSCOPY/HOLMIUM LASER/STENT PLACEMENT;  Surgeon: Carolee Sherwood JONETTA DOUGLAS, MD;  Location: [facility];  Service: Urology;  Laterality: Left;   CYSTOSCOPY/URETEROSCOPY/HOLMIUM LASER/STENT PLACEMENT Bilateral 05/28/2022   Procedure: CYSTOSCOPY/ RETROGRADE/URETEROSCOPY/HOLMIUM LASER/STENT PLACEMENT;  Surgeon: [name], MD;  Location: [facility];  Service: Urology;  Laterality: Bilateral;   EXTRACORPOREAL SHOCK WAVE  LITHOTRIPSY Left 09/03/2020   Procedure: LEFT EXTRACORPOREAL SHOCK WAVE LITHOTRIPSY (ESWL);  Surgeon: Carolee Sherwood JONETTA DOUGLAS, MD;  Location: [facility];  Service: Urology;  Laterality: Left;   IR RADIOLOGIST EVAL & MGMT  09/23/2023   IR RADIOLOGIST EVAL & MGMT  12/22/2023   IR RADIOLOGIST EVAL & MGMT  03/17/2024   LITHOTRIPSY  2001   lower back  2020   fusion    X2    L 4 to L 6   L2 L3   NASAL SEPTUM SURGERY  2005   palate remove for osa   RADIOLOGY WITH ANESTHESIA Right 12/02/2023   Procedure: CT WITH ANESTHESIA;  Surgeon: Radiologist, Medication, MD;  Location: [facility];  Service: Radiology;  Laterality: Right;   TOTAL KNEE ARTHROPLASTY Right 06/20/2024   Procedure: ARTHROPLASTY, KNEE, TOTAL;  Surgeon: Yvone Rush, MD;  Location: [facility];  Service: Orthopedics;  Laterality: Right;   Patient Active Problem List   Diagnosis Date Noted   Osteoarthritis of right knee 06/19/2024    PCP: [name], MD   REFERRING PROVIDER: Yvone Rush, MD   REFERRING DIAG: [phone] (ICD-10-CM) - Presence of right artificial knee joint   THERAPY DIAG:  Right knee pain, unspecified chronicity  Localized edema  Muscle weakness (generalized)  Other abnormalities of gait and  mobility  Rationale for Evaluation and Treatment: Rehabilitation  ONSET DATE: R TKA 06/20/24  SUBJECTIVE:  Per eval: Pt endorses hx of chronic knee pain, 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 Since surgery has been receiving assistance from spouse and son for household tasks and lower body dressing. Utilizing RW. Has HEP from hospital. Able to manage steps out of home w/ inc time/difficulty.  Denies calf pain/swelling, no SOB/chest pain, no fevers/chills.   SUBJECTIVE STATEMENT: Patient reports that he is doing okay with the knee but he is having more back pain. He has a history of spinal surgeries in LB and neck. The MD has discussed use of an indwelling spinal stimulator for pain. He has  most pain when he is trying to sleep. He sleeps on his back and on his side. He has tried pillows between knees when he is on his side which may help some. He has been carrying water  down hill to water  his horses which irritates back pain.   PERTINENT HISTORY: Asthma, DM2, GERD, GIB, gout, hx MI, HTN Hx lumbar/cervical fusions -  lumbar fusion L4/5 and L 2/3 - 2024 L2/3; 2017 - L4/5  Cervical C6/7 -2019 most recent   PAIN:  Are you having pain: 4/10 with medication; at rest 1-2/10   Per eval:  Location/description: R knee  Best-worst over past week: 4-7/10  - aggravating factors: moving knee, WB, lower body dressing - Easing factors: ice, medication    PRECAUTIONS: fall  WEIGHT BEARING RESTRICTIONS: No  FALLS:  Has patient fallen in last 6 months? 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  DIAGNOSTIC FINDINGS:  R TKA DOS 06/20/24   PATIENT SURVEYS:  LEFS: 11/80    SENSATION: Does not endorse any sensory complaints  EDEMA/INSPECTION:  Incision obscured by bandage which is well-saturated in upper portion (approx 40-50%), no significant erythema, mild bruising about knee as expected ; edema WNL ; no calf pain or swelling, no TTP about calf or pain with DF   LOWER EXTREMITY ROM:      Right eval Left eval R 06/29/24 07/11/24  07/18/24 07/20/24  Hip flexion        Hip extension        Hip internal rotation        Hip external rotation        Knee extension   A: lacking 5-6 deg with heel prop  -10 deg Heel propped   -8-9  With heel propped   Knee flexion AA: 69 deg * P: 80 deg *  AA: 75 deg seated * AAROM supine  98 deg  AAROM supine 105   (Blank rows = not tested) (Key: WFL = within functional limits not formally assessed, * = concordant pain, s = stiffness/stretching sensation, NT = not tested)  Comments:    LOWER EXTREMITY MMT:    MMT Right eval Left eval  Hip flexion    Hip abduction (modified sitting)    Hip internal rotation  Hip external rotation    Knee flexion    Knee extension    Ankle dorsiflexion     (Blank rows = not tested) (Key: WFL = within functional limits not formally assessed, * = concordant pain, s = stiffness/stretching sensation, NT = not tested)  Comments:  deferred on eval given acuity of surgery   FUNCTIONAL TESTS:  TUG: 24.98sec RW standard chair   06/29/24: - TUG: 12.84sec w/ walking sticks from lowest mat   GAIT: Distance walked: within clinic Assistive device utilized: Environmental Consultant - 2 wheeled Level of assistance: Modified independence Comments: step to pattern leading with R                                                                                                                               TREATMENT:  [facility] Adult PT Treatment:                                                DATE: 07/27/2024 Therapeutic Exercise: Recumbent bike full revolutions x 7 min for ROM   Neuromuscular re-ed: Sitting  Anterior posterior pelvic tilt  Supine:  Core activation supine  Core activation marching x 10  Core activation shoulder flexion 3# each UE shoulder flexion  Small range SLR  10 x 3 sec  Small range SLR in ER 10 x 3 sec Hamstring stretch 30 sec x 2 with strap  Quad set with deflated ball behind knee 10 x 5 sec SAQ with bolster 12 x 3 sec  Standing TKE with ball x10 --> second set with blue TB  Therapeutic Activity: 4 step up leading with Rt leg x 10 4 step stair navigating with step through pattern Runners step up 4 inch  step  Step up 4 in step step over step  6 step up leading with Rt leg x 10  Runners step up 6 in step  Step up step over step 6 inch step  Knee extension heel propped on 12 in step 10 sec hold x 5 Knee flexion stretch foot on 12 inch step 10 sec x 5 Knee extension heel propped on foam roll AROM  (-)7-8 deg Modalities:    TREATMENT:  [facility] Adult PT Treatment:                                                DATE: 07/20/2024 Therapeutic Exercise: Recumbent bike backward revolutions x 2 min; forward full revolutions x 4 min  Seated HS stretch Neuromuscular re-ed: Supine:  Quad set with deflated ball behind knee 10 x 5 sec SAQ with bolster 12 x 3 sec Small range SLR 10 x 3 sec Small range SLR in ER 10  x 3 sec  TKE on orange ball 10 x 5 sec Standing TKE with ball x10 --> second set with blue TB  Therapeutic Activity: Supine heel slide with orange PB & assist from strap Seated knee flexion with slider 2x10 with stretch at end range 4 step up leading with Rt leg x 10 4 step stair navigating with step through pattern 4 lateral step tap down with L x 10  6 step up leading with Rt leg x 10  Alternate toe tap to 6 inch step x 20  Calf stretch R 30 sec x 2 gastroc knee straight; soleus stretch with knee bent 30 sec x 2  Mini lunge 10 sec x 5 R foot fwd  Hip abduction leading with heel R/L x 10  Side steps at counter R/L x 4 each Heel toe/backward walking at counter x 4 each  Modalities: Vaso 34 deg low pressure x 10 min to address soreness and pain    PATIENT EDUCATION:  Education details: terminal knee extension variation  Person educated: Patient Education method: Explanation, Demonstration, Tactile cues, Verbal cues Education comprehension: verbalized understanding, returned demonstration, verbal cues required, tactile cues required, and needs further education     HOME EXERCISE PROGRAM: Access Code: [email or web address] URL: [email or web address] Date:  07/27/2024 Prepared by: Shaquanta Harkless  Exercises - Seated Quad Set  - 3-4 x daily - 1 sets - 8-10 reps - Seated Heel Slide  - 3-4 x daily - 1 sets - 8-10 reps - Seated Calf Stretch with Strap  - 3-4 x daily - 1 sets - 8 reps - Mini Squat with Counter Support  - 3-4 x daily - 1 sets - 5 reps - Supine Transversus Abdominis Bracing with Pelvic Floor Contraction  - 2 x daily - 7 x weekly - 1 sets - 10 reps - 10sec  hold - Standing Anatomical Position with Scapular Retraction and Depression at Wall  - 2 x daily - 7 x weekly - 1 sets - 5-10 reps - 5-10 sec  hold - Standing Terminal Knee Extension with Resistance  - 1 x daily - 7 x weekly - 1-2 sets - 10 reps - 5-10 sec hold - Gastroc Stretch on Wall  - 1 x daily - 7 x weekly - 1 sets - 3 reps - 30 sec  hold - Standing Soleus Stretch  - 1 x daily - 7 x weekly - 1 sets - 3 reps - 30 sec  hold - Seated Anterior Pelvic Tilt  - 2 x daily - 7 x weekly - 1 sets - 10 reps - 2-3 sec  hold - Supine March  - 2 x daily - 7 x weekly - 1-2 sets - 10 reps - 2 sec  hold - Dead Bug Alternating Arm Extension  - 2 x daily - 7 x weekly - 1-2 sets - 10 reps - 2 sec  hold - Bridge  - 1-2 x daily - 7 x weekly - 1-2 sets - 10 reps - 3-5 sec  hold - Step Up  - 1 x daily - 7 x weekly - 1 sets - 10 reps - 2 sec  hold - Runner's Step Up/Down  - 1 x daily - 7 x weekly - 1 sets - 10-20 reps - 2 sec  hold - Standing Knee Flexion Stretch on Step  - 1 x daily - 7 x weekly - 1 sets - 3-5 reps - 10-200 sec  hold  ASSESSMENT:  CLINICAL IMPRESSION: Patient reports  increased back pain in the past week. He has been carrying water  for his horses over icy surfaces which likely contributed to increased back pain. Treatment focus today was on core stabilization exercises and activities to address LBP. Patient has difficulty with engaging core - improves with practice. Added core stabilization activities and continued with LE stretching and strengthening. Patient demonstrated good execution of  step over step ascending and descending 4 inch and 6 inch steps able to achieve full revolutions on recumbent bike today. AAROM R knee extension heel propped on foam roll ~ (-)7-8 degrees. Improved gait pattern without assistive device noted with ambulation. Continued to work on AROM to [employer] and strengthening R knee/LE. Working on terminal knee extension. Progressing well toward rehab goals. Patient requests decreasing frequency for PT to 1x/wk.   Per eval: Patient is a pleasant 64 y.o. gentleman who was seen today for physical therapy evaluation and treatment for R TKA DOS 06/20/24  He endorses expected post op limitations in ADLs and functional mobility, requiring RW for ambulation. No post op red flags, although upper portion of bandage is observed to be well-saturated (has been in ACE wrap since surgery) - advised pt to communicate w/ surgeon's office re: wound care/dressing management, he states he will call after our session. He demonstrates anticipated deficits in knee ROM, quad activation, and altered kinematics w/ gait/transfers. TUG is indicative of fall risk. No adverse events, tolerates exam/HEP well overall. Self care education as above. Recommend skilled PT to address aforementioned deficits with aim of improving functional tolerance and reducing pain with typical activities. Pt departs today's session in no acute distress, all voiced concerns/questions addressed appropriately from PT perspective.    OBJECTIVE IMPAIRMENTS: Abnormal gait, decreased activity tolerance, decreased balance, decreased endurance, decreased mobility, difficulty walking, decreased ROM, decreased strength, increased edema, impaired perceived functional ability, impaired flexibility, improper body mechanics, and pain.    GOALS:  SHORT TERM GOALS: Target date: 07/20/2024  Pt will demonstrate appropriate understanding and performance of initially prescribed HEP in order to facilitate improved independence with  management of symptoms.  Baseline: HEP established   Goal status: INITIAL   2. Pt will report at least 25% improvement in overall pain levels over past week in order to facilitate improved tolerance to typical daily activities.   Baseline: 4-9/10  Goal status: INITIAL    LONG TERM GOALS: Target date: 08/17/2024   Pt will score 50/80 or greater on LEFS in order to demonstrate improved perception of function due to symptoms (MCID 9 pts) Baseline: 11/80 Goal status: INITIAL  2.  Pt will demonstrate at least 0-110 degrees of knee AROM on surgical limb in order to facilitate improved tolerance to functional movements such as squatting, walking, and stair navigation.  Baseline: see ROM chart above Goal status: INITIAL  3.  Pt will demonstrate appropriate performance of final prescribed HEP in order to facilitate improved self-management of symptoms post-discharge.   Baseline: initial HEP prescribed   Goal status: INITIAL    4.  Pt will be able to perform TUG in less than or equal to 13 sec with LRAD in order to indicate reduced risk of falling (cutoff score for fall risk 13.5 sec in community dwelling older adults per [facility] et al, 2000)  Baseline: 24.98sec RW  Goal status: INITIAL    5. Pt will report at least 50% decrease in overall pain levels in past week in order to facilitate improved tolerance to basic ADLs/mobility.   Baseline: 4-9/10  Goal status:  INITIAL    6. Pt will demonstrate symmetrical knee MMT between surgical and non-surgical limb in order to facilitate improved functional strength and mechanics.  Baseline: deferred on eval given proximity to surgery  Goal status: INITIAL     PLAN:  PT FREQUENCY: 2x/week  PT DURATION: 8 weeks  PLANNED INTERVENTIONS: 97164- PT Re-evaluation, 97750- Physical Performance Testing, 97110-Therapeutic exercises, 97530- Therapeutic activity, [id number]- Neuromuscular re-education, 97535- Self Care, [id number]- Manual therapy, [id number]- Gait  training, [phone]- Aquatic Therapy, [phone]- Vasopneumatic device, [phone] (1-2 muscles), [id number] (3+ muscles)- Dry Needling, Patient/Family education, Balance training, Stair training, Taping, Joint mobilization, Scar mobilization, Cryotherapy, and Moist heat  PLAN FOR NEXT SESSION: Review/update HEP PRN. Work on [employer] exercises as appropriate with emphasis on quad activation, knee ext/flex ROM, functional mechanics. Symptom modification strategies as indicated/appropriate. Mindful of MI hx and spinal fusions. Print TKE variation with blue TB (added to HEP)  Granvil Djordjevic P. Ina PT, MPH 07/27/24 3:38 PM   "

## 2024-08-03 ENCOUNTER — Encounter: Payer: Self-pay | Admitting: Rehabilitative and Restorative Service Providers"

## 2024-08-03 ENCOUNTER — Ambulatory Visit: Admitting: Rehabilitative and Restorative Service Providers"

## 2024-08-03 DIAGNOSIS — M6281 Muscle weakness (generalized): Secondary | ICD-10-CM

## 2024-08-03 DIAGNOSIS — R2689 Other abnormalities of gait and mobility: Secondary | ICD-10-CM

## 2024-08-03 DIAGNOSIS — R600 Localized edema: Secondary | ICD-10-CM

## 2024-08-03 DIAGNOSIS — M25561 Pain in right knee: Secondary | ICD-10-CM

## 2024-08-03 NOTE — Therapy (Signed)
 " OUTPATIENT PHYSICAL THERAPY TREATMENT Medicare 10 th visit Progress Note Reporting Period 06/22/25 to 08/03/24  See note below for Objective Data and Assessment of Progress/Goals.       Patient Name: Hunter Krause MRN: [id number] DOB:[birth year], 64 y.o., male Today's Date: 08/03/2024  END OF SESSION:  PT End of Session - 08/03/24 1529     Visit Number 10    Number of Visits 17    Date for Recertification  08/17/24    Authorization Type BCBS    Authorization Time Period 60VL per year    PT Start Time 1530    PT Stop Time 1620    PT Time Calculation (min) 50 min    Activity Tolerance Patient tolerated treatment well         Past Medical History:  Diagnosis Date   Arthritis    Asthma 1994   allergy induced asthma   Cancer (HCC)    kidney lesion   Coronary artery disease    Diabetes mellitus without complication (HCC) type 2    GERD (gastroesophageal reflux disease)    GI bleed 12/01/2021   admit to [facility]   GI bleed 12/01/2021   admit to [facility]   Gout    no recent flares   History of hiatal hernia    History of kidney stones    Hypertension    treated with hydrocholothiazide   Myocardial infarction (HCC) 2019   Pneumonia    Sleep apnea    treated with surgery. No problems now.   Past Surgical History:  Procedure Laterality Date   ARTHROSCOPY KNEE W/ DRILLING  2004   right knee   Blephroplasty  upper     cervical neck fusion     C 3 to C 5 has plates and screws   CHOLECYSTECTOMY     CORONARY ANGIOPLASTY  2019   stent x 1   CORONARY STENT INTERVENTION     CYSTOSCOPY/URETEROSCOPY/HOLMIUM LASER/STENT PLACEMENT Left 09/10/2020   Procedure: CYSTOSCOPY LEFT URETEROSCOPY/HOLMIUM LASER/STENT PLACEMENT;  Surgeon: Carolee Sherwood JONETTA DOUGLAS, MD;  Location: [facility];  Service: Urology;  Laterality: Left;   CYSTOSCOPY/URETEROSCOPY/HOLMIUM LASER/STENT PLACEMENT Bilateral 05/28/2022   Procedure: CYSTOSCOPY/ RETROGRADE/URETEROSCOPY/HOLMIUM LASER/STENT  PLACEMENT;  Surgeon: [name], MD;  Location: [facility];  Service: Urology;  Laterality: Bilateral;   EXTRACORPOREAL SHOCK WAVE LITHOTRIPSY Left 09/03/2020   Procedure: LEFT EXTRACORPOREAL SHOCK WAVE LITHOTRIPSY (ESWL);  Surgeon: Carolee Sherwood JONETTA DOUGLAS, MD;  Location: [facility];  Service: Urology;  Laterality: Left;   IR RADIOLOGIST EVAL & MGMT  09/23/2023   IR RADIOLOGIST EVAL & MGMT  12/22/2023   IR RADIOLOGIST EVAL & MGMT  03/17/2024   LITHOTRIPSY  2001   lower back  2020   fusion    X2    L 4 to L 6   L2 L3   NASAL SEPTUM SURGERY  2005   palate remove for osa   RADIOLOGY WITH ANESTHESIA Right 12/02/2023   Procedure: CT WITH ANESTHESIA;  Surgeon: Radiologist, Medication, MD;  Location: [facility];  Service: Radiology;  Laterality: Right;   TOTAL KNEE ARTHROPLASTY Right 06/20/2024   Procedure: ARTHROPLASTY, KNEE, TOTAL;  Surgeon: Yvone Rush, MD;  Location: [facility];  Service: Orthopedics;  Laterality: Right;   Patient Active Problem List   Diagnosis Date Noted   Osteoarthritis of right knee 06/19/2024    PCP: [name], MD   REFERRING PROVIDER: Yvone Rush, MD   REFERRING DIAG: [phone] (ICD-10-CM) - Presence of right  artificial knee joint   THERAPY DIAG:  Right knee pain, unspecified chronicity  Localized edema  Muscle weakness (generalized)  Other abnormalities of gait and mobility  Rationale for Evaluation and Treatment: Rehabilitation  ONSET DATE: R TKA 06/20/24  SUBJECTIVE:  Per eval: Pt endorses hx of chronic knee pain, 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 Since surgery has been receiving assistance from spouse and son for household tasks and lower body dressing. Utilizing RW. Has HEP from hospital. Able to manage steps out of home w/ inc time/difficulty.  Denies calf pain/swelling, no SOB/chest pain, no fevers/chills.   SUBJECTIVE STATEMENT: Patient reports that his knee is doing okay. 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 He has continued back pain mostly at night. He has pain that does not respond well to any one thing. He has tried TENS, stretching, moving around. Overdid his exercises Sunday and had increased knee pain Monday. Felt some better yesterday. Has some R knee pain and L hip pain. He feels the hip pain is due to limping. He has a history of spinal surgeries in LB and neck. The MD has discussed use of an indwelling spinal stimulator for pain.   PERTINENT HISTORY: Asthma, DM2, GERD, GIB, gout, hx MI, HTN Hx lumbar/cervical fusions -  lumbar fusion L4/5 and L 2/3 - 2024 L2/3; 2017 - L4/5  Cervical C6/7 -2019 most recent   PAIN:  Are you having pain: 4/10 with medication; at rest 1-2/10   Per eval:  Location/description: R knee  Best-worst over past week: 4-5/10  - aggravating factors: moving knee, WB, lower body dressing - Easing factors: ice, medication    PRECAUTIONS: fall  WEIGHT BEARING RESTRICTIONS: No  FALLS:  Has patient fallen in last 6 months? 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  DIAGNOSTIC FINDINGS:  R TKA DOS 06/20/24   PATIENT SURVEYS:  LEFS: 11/80    SENSATION: Does not endorse any sensory complaints  EDEMA/INSPECTION:  Incision obscured by bandage which is well-saturated in upper portion (approx 40-50%), no significant erythema, mild bruising about knee as expected ; edema WNL ; no calf pain or swelling, no TTP about calf or pain  with DF   LOWER EXTREMITY ROM:      Right eval Left eval R 06/29/24 07/11/24  07/18/24 07/20/24 08/04/23  Hip flexion         Hip extension         Hip internal rotation         Hip external rotation         Knee extension   A: lacking 5-6 deg with heel prop -10 deg Heel propped   -8-9  With heel propped  -5  with heel propped   Knee flexion AA: 69 deg * P: 80 deg *  AA: 75 deg seated * AAROM supine  98 deg  AAROM supine 105  AAROM Supine 122  (Blank rows = not tested) (Key:  WFL = within functional limits not formally assessed, * = concordant pain, s = stiffness/stretching sensation, NT = not tested)  Comments:    LOWER EXTREMITY MMT:    MMT Right eval Left eval  Hip flexion    Hip abduction (modified sitting)    Hip internal rotation    Hip external rotation    Knee flexion    Knee extension    Ankle dorsiflexion     (Blank rows = not tested) (Key: WFL = within functional limits not formally assessed, * = concordant pain, s = stiffness/stretching sensation, NT = not tested)  Comments:  deferred on eval given acuity of surgery   FUNCTIONAL TESTS:  TUG: 24.98sec RW standard chair   06/29/24: - TUG: 12.84sec w/ walking sticks from lowest mat   GAIT: Distance walked: within clinic Assistive device utilized: Environmental Consultant - 2 wheeled Level of assistance: Modified independence Comments: step to pattern leading with R                                                                                                                               TREATMENT:  [facility] Adult PT Treatment:                                                DATE: 08/03/2024 Therapeutic Exercise: Reassessment for medicare 10th visit  Recumbent bike full revolutions Level 2 x 5 min for ROM   Neuromuscular re-ed: Sitting  Anterior posterior pelvic tilt  Supine:  Core activation supine  Core activation marching x 10  Core activation shoulder flexion with green TB both hands   Small range SLR  10 x 3 sec   Small range SLR in ER 10 x 3 sec Hip flexor stretch supine 30 sec x 3 R/L  Piriformis stretch L 30 sec x 2  Hamstring stretch 30 sec x 2 R/L with strap  Quad set with deflated ball behind knee 10 x 5 sec SAQ with bolster 12 x 3 sec  Standing    [facility] Adult PT Treatment:                                                DATE: 07/27/2024 Therapeutic Exercise: Recumbent bike full revolutions x 7 min for ROM   Neuromuscular re-ed: Sitting  Anterior posterior pelvic tilt  Supine:  Core activation supine  Core activation marching x 10  Core activation shoulder flexion 3# each UE shoulder flexion  Small range SLR  10 x 3 sec  Small range SLR in ER 10 x 3 sec Hamstring stretch 30 sec x 2 with strap  Quad set with deflated ball behind knee 10 x 5 sec  SAQ with bolster 12 x 3 sec  Standing TKE with ball x10 --> second set with blue TB  Therapeutic Activity: 4 step up leading with Rt leg x 10 4 step stair navigating with step through pattern Runners step up 4 inch step  Step up 4 in step step over step  6 step up leading with Rt leg x 10  Runners step up 6 in step  Step up step over step 6 inch step  Knee extension heel propped on 12 in step 10 sec hold x 5 Knee flexion stretch foot on 12 inch step 10 sec x 5 Knee extension heel propped on foam roll AROM  (-)7-8 deg Modalities:    TREATMENT:  [facility] Adult PT Treatment:                                                DATE: 07/20/2024 Therapeutic Exercise: Recumbent bike backward revolutions x 2 min; forward full revolutions x 4 min  Seated HS stretch Neuromuscular re-ed: Supine:  Quad set with deflated ball behind knee 10 x 5 sec SAQ with bolster 12 x 3 sec Small range SLR 10 x 3 sec Small range SLR in ER 10 x 3 sec  TKE on orange ball 10 x 5 sec Standing TKE with ball x10 --> second set with blue TB  Therapeutic Activity: Supine heel slide with orange PB & assist from strap Seated knee flexion with slider 2x10 with stretch  at end range 4 step up leading with Rt leg x 10 4 step stair navigating with step through pattern 4 lateral step tap down with L x 10  6 step up leading with Rt leg x 10  Alternate toe tap to 6 inch step x 20  Calf stretch R 30 sec x 2 gastroc knee straight; soleus stretch with knee bent 30 sec x 2  Mini lunge 10 sec x 5 R foot fwd  Hip abduction leading with heel R/L x 10  Side steps at counter R/L x 4 each Heel toe/backward walking at counter x 4 each  Modalities: Vaso 34 deg low pressure x 10 min to address soreness and pain    PATIENT EDUCATION:  Education details: terminal knee extension variation  Person educated: Patient Education method: Explanation, Demonstration, Tactile cues, Verbal cues Education comprehension: verbalized understanding, returned demonstration, verbal cues required, tactile cues required, and needs further education     HOME EXERCISE PROGRAM: Access Code: [email or web address] URL: [email or web address] Date: 08/03/2024 Prepared by: Ladene Allocca  Exercises - Seated Quad Set  - 3-4 x daily - 1 sets - 8-10 reps - Seated Heel Slide  - 3-4 x daily - 1 sets - 8-10 reps - Seated Calf Stretch with Strap  - 3-4 x daily - 1 sets - 8 reps - Mini Squat with Counter Support  - 3-4 x daily - 1 sets - 5 reps - Supine Transversus Abdominis Bracing with Pelvic Floor Contraction  - 2 x daily - 7 x weekly - 1 sets - 10 reps - 10sec  hold - Standing Anatomical Position with Scapular Retraction and Depression at Wall  - 2 x daily - 7 x weekly - 1 sets - 5-10 reps - 5-10 sec  hold - Standing Terminal Knee Extension with Resistance  - 1 x daily - 7 x weekly - 1-2 sets - 10 reps -  5-10 sec hold - Gastroc Stretch on Wall  - 1 x daily - 7 x weekly - 1 sets - 3 reps - 30 sec  hold - Standing Soleus Stretch  - 1 x daily - 7 x weekly - 1 sets - 3 reps - 30 sec  hold - Seated Anterior Pelvic Tilt  - 2 x daily - 7 x weekly - 1 sets - 10 reps - 2-3 sec  hold - Supine March  - 2  x daily - 7 x weekly - 1-2 sets - 10 reps - 2 sec  hold - Dead Bug Alternating Arm Extension  - 2 x daily - 7 x weekly - 1-2 sets - 10 reps - 2 sec  hold - Bridge  - 1-2 x daily - 7 x weekly - 1-2 sets - 10 reps - 3-5 sec  hold - Step Up  - 1 x daily - 7 x weekly - 1 sets - 10 reps - 2 sec  hold - Runner's Step Up/Down  - 1 x daily - 7 x weekly - 1 sets - 10-20 reps - 2 sec  hold - Standing Knee Flexion Stretch on Step  - 1 x daily - 7 x weekly - 1 sets - 3-5 reps - 10-200 sec  hold - Supine Bridge with Resistance Band  - 1 x daily - 7 x weekly - 1-2 sets - 10 reps - 5-10 sec  hold - Hip Flexor Stretch at Edge of Bed  - 2 x daily - 7 x weekly - 1 sets - 3 reps - 30 sec  hold - Supine Piriformis Stretch with Leg Straight  - 2 x daily - 7 x weekly - 1 sets - 3 reps - 30 sec  hold  ASSESSMENT:  CLINICAL IMPRESSION: Patient reports continued increased back pain including difficulty sleeping at night. He has also had some pain in the L hip. Assessment of hips demonstrate significant tightness in hip flexors L > R and L posterior hip through glut med/piriformis. Added stretches to address tightness as well as continued glut strengthening. Overall patient continues to demonstrate improvement in ROM, strength, gait pattern, ambulation, endurance for functional activities. Treatment has consisted of ROM, stretching, strengthening for R/L LEs as well as core stabilization exercises and activities to address LBP. Patient has difficulty with engaging core - improves with practice. Patient demonstrated good execution of step over step ascending and descending 4 inch and 6 inch steps; improved gait pattern without assistive device noted with ambulation.  Progressing well toward rehab goals. Patient requests decreasing frequency for PT to 1x/wk.   Per eval: Patient is a pleasant 64 y.o. gentleman who was seen today for physical therapy evaluation and treatment for R TKA DOS 06/20/24  He endorses expected post op  limitations in ADLs and functional mobility, requiring RW for ambulation. No post op red flags, although upper portion of bandage is observed to be well-saturated (has been in ACE wrap since surgery) - advised pt to communicate w/ surgeon's office re: wound care/dressing management, he states he will call after our session. He demonstrates anticipated deficits in knee ROM, quad activation, and altered kinematics w/ gait/transfers. TUG is indicative of fall risk. No adverse events, tolerates exam/HEP well overall. Self care education as above. Recommend skilled PT to address aforementioned deficits with aim of improving functional tolerance and reducing pain with typical activities. Pt departs today's session in no acute distress, all voiced concerns/questions addressed appropriately from PT perspective.  OBJECTIVE IMPAIRMENTS: Abnormal gait, decreased activity tolerance, decreased balance, decreased endurance, decreased mobility, difficulty walking, decreased ROM, decreased strength, increased edema, impaired perceived functional ability, impaired flexibility, improper body mechanics, and pain.    GOALS:  SHORT TERM GOALS: Target date: 07/20/2024  Pt will demonstrate appropriate understanding and performance of initially prescribed HEP in order to facilitate improved independence with management of symptoms.  Baseline: HEP established   Goal status: met   2. Pt will report at least 25% improvement in overall pain levels over past week in order to facilitate improved tolerance to typical daily activities.   Baseline: 4-9/10  Goal status: met  LONG TERM GOALS: Target date: 08/17/2024   Pt will score 50/80 or greater on LEFS in order to demonstrate improved perception of function due to symptoms (MCID 9 pts) Baseline: 11/80 Goal status: INITIAL  2.  Pt will demonstrate at least 0-110 degrees of knee AROM on surgical limb in order to facilitate improved tolerance to functional movements such as  squatting, walking, and stair navigation.  Baseline: see ROM chart above 08/03/24: (-)5 to 122 Goal status: partially met  3.  Pt will demonstrate appropriate performance of final prescribed HEP in order to facilitate improved self-management of symptoms post-discharge.   Baseline: initial HEP prescribed   Goal status: INITIAL    4.  Pt will be able to perform TUG in less than or equal to 13 sec with LRAD in order to indicate reduced risk of falling (cutoff score for fall risk 13.5 sec in community dwelling older adults per [facility] et al, 2000)  Baseline: 24.98sec RW  Goal status: on going     5. Pt will report at least 50% decrease in overall pain levels in past week in order to facilitate improved tolerance to basic ADLs/mobility.   Baseline: 4-9/10  Goal status: on going     6. Pt will demonstrate symmetrical knee MMT between surgical and non-surgical limb in order to facilitate improved functional strength and mechanics.  Baseline: deferred on eval given proximity to surgery  Goal status: on going     PLAN:  PT FREQUENCY: 2x/week  PT DURATION: 8 weeks  PLANNED INTERVENTIONS: 97164- PT Re-evaluation, 97750- Physical Performance Testing, 97110-Therapeutic exercises, 97530- Therapeutic activity, [id number]- Neuromuscular re-education, 97535- Self Care, [id number]- Manual therapy, [id number]- Gait training, [phone]- Aquatic Therapy, [phone]- Vasopneumatic device, [phone] (1-2 muscles), 20561 (3+ muscles)- Dry Needling, Patient/Family education, Balance training, Stair training, Taping, Joint mobilization, Scar mobilization, Cryotherapy, and Moist heat  PLAN FOR NEXT SESSION: Review/update HEP PRN. Work on [employer] exercises as appropriate with emphasis on quad activation, knee ext/flex ROM, functional mechanics. Symptom modification strategies as indicated/appropriate. Mindful of MI hx and spinal fusions. Print TKE variation with blue TB (added to HEP)  Aliani Caccavale P. Ina PT, MPH 08/03/24 3:32 PM   "

## 2024-08-10 ENCOUNTER — Ambulatory Visit: Admitting: Rehabilitative and Restorative Service Providers"

## 2024-08-17 ENCOUNTER — Ambulatory Visit: Admitting: Rehabilitative and Restorative Service Providers"

## 2024-08-24 ENCOUNTER — Ambulatory Visit: Admitting: Rehabilitative and Restorative Service Providers"
